# Patient Record
Sex: Female | Born: 1960 | Race: Black or African American | Hispanic: No | Marital: Single | Smoking: Former smoker
Health system: Southern US, Community
[De-identification: ages and names within clinical notes are randomized; demographics above are authoritative.]

## PROBLEM LIST (undated history)

## (undated) DIAGNOSIS — E785 Hyperlipidemia, unspecified: Secondary | ICD-10-CM

## (undated) DIAGNOSIS — M109 Gout, unspecified: Secondary | ICD-10-CM

## (undated) DIAGNOSIS — E119 Type 2 diabetes mellitus without complications: Secondary | ICD-10-CM

## (undated) DIAGNOSIS — K219 Gastro-esophageal reflux disease without esophagitis: Secondary | ICD-10-CM

## (undated) HISTORY — PX: PARTIAL HYSTERECTOMY: SHX80

---

## 2005-02-14 ENCOUNTER — Ambulatory Visit: Payer: Self-pay | Admitting: Occupational Therapy

## 2005-02-24 ENCOUNTER — Ambulatory Visit: Payer: Self-pay | Admitting: Occupational Therapy

## 2005-03-27 ENCOUNTER — Ambulatory Visit: Payer: Self-pay | Admitting: Occupational Therapy

## 2005-04-26 ENCOUNTER — Ambulatory Visit: Payer: Self-pay | Admitting: Occupational Therapy

## 2005-05-27 ENCOUNTER — Ambulatory Visit: Payer: Self-pay | Admitting: Occupational Therapy

## 2006-05-29 ENCOUNTER — Ambulatory Visit: Payer: Self-pay | Admitting: Nurse Practitioner

## 2007-06-26 ENCOUNTER — Ambulatory Visit: Payer: Self-pay | Admitting: Nurse Practitioner

## 2008-07-14 ENCOUNTER — Ambulatory Visit: Payer: Self-pay | Admitting: Nurse Practitioner

## 2009-07-20 ENCOUNTER — Ambulatory Visit: Payer: Self-pay | Admitting: Nurse Practitioner

## 2010-07-23 ENCOUNTER — Ambulatory Visit: Payer: Self-pay | Admitting: Nurse Practitioner

## 2011-09-03 ENCOUNTER — Ambulatory Visit: Payer: Self-pay | Admitting: Nurse Practitioner

## 2012-09-03 ENCOUNTER — Ambulatory Visit: Payer: Self-pay | Admitting: Nurse Practitioner

## 2013-03-03 ENCOUNTER — Ambulatory Visit: Payer: Self-pay | Admitting: Internal Medicine

## 2013-09-07 ENCOUNTER — Ambulatory Visit: Payer: Self-pay | Admitting: Nurse Practitioner

## 2013-10-22 ENCOUNTER — Ambulatory Visit: Payer: Self-pay | Admitting: Gastroenterology

## 2014-01-04 ENCOUNTER — Ambulatory Visit: Payer: Self-pay | Admitting: Nurse Practitioner

## 2014-06-30 ENCOUNTER — Ambulatory Visit: Payer: Self-pay | Admitting: Nurse Practitioner

## 2014-10-31 ENCOUNTER — Other Ambulatory Visit: Payer: Self-pay | Admitting: Nurse Practitioner

## 2014-10-31 DIAGNOSIS — N939 Abnormal uterine and vaginal bleeding, unspecified: Secondary | ICD-10-CM

## 2014-10-31 DIAGNOSIS — Z1239 Encounter for other screening for malignant neoplasm of breast: Secondary | ICD-10-CM

## 2014-10-31 DIAGNOSIS — Z1231 Encounter for screening mammogram for malignant neoplasm of breast: Secondary | ICD-10-CM

## 2014-11-01 ENCOUNTER — Ambulatory Visit
Admission: RE | Admit: 2014-11-01 | Discharge: 2014-11-01 | Disposition: A | Discharge status: Home / Self Care | Payer: Medicare Other | Source: Ambulatory Visit | Attending: Nurse Practitioner | Admitting: Nurse Practitioner

## 2014-11-01 ENCOUNTER — Ambulatory Visit: Payer: Medicare Other

## 2014-11-01 DIAGNOSIS — N939 Abnormal uterine and vaginal bleeding, unspecified: Secondary | ICD-10-CM | POA: Diagnosis not present

## 2014-11-09 ENCOUNTER — Ambulatory Visit
Admission: RE | Admit: 2014-11-09 | Discharge: 2014-11-09 | Disposition: A | Discharge status: Home / Self Care | Payer: Medicare Other | Source: Ambulatory Visit | Attending: Nurse Practitioner | Admitting: Nurse Practitioner

## 2014-11-09 DIAGNOSIS — Z1231 Encounter for screening mammogram for malignant neoplasm of breast: Secondary | ICD-10-CM | POA: Insufficient documentation

## 2014-11-09 DIAGNOSIS — Z1239 Encounter for other screening for malignant neoplasm of breast: Secondary | ICD-10-CM

## 2015-08-15 ENCOUNTER — Other Ambulatory Visit: Payer: Self-pay | Admitting: Nurse Practitioner

## 2015-08-15 DIAGNOSIS — Z1231 Encounter for screening mammogram for malignant neoplasm of breast: Secondary | ICD-10-CM

## 2015-11-13 ENCOUNTER — Ambulatory Visit: Admission: RE | Admit: 2015-11-13 | Payer: Medicare Other | Source: Ambulatory Visit

## 2015-11-20 ENCOUNTER — Ambulatory Visit: Payer: Medicare Other

## 2015-11-27 ENCOUNTER — Ambulatory Visit
Admission: RE | Admit: 2015-11-27 | Discharge: 2015-11-27 | Disposition: A | Discharge status: Home / Self Care | Payer: Medicare Other | Source: Ambulatory Visit | Attending: Nurse Practitioner | Admitting: Nurse Practitioner

## 2015-11-27 DIAGNOSIS — Z1231 Encounter for screening mammogram for malignant neoplasm of breast: Secondary | ICD-10-CM | POA: Diagnosis present

## 2016-08-16 ENCOUNTER — Other Ambulatory Visit: Payer: Self-pay | Admitting: Nurse Practitioner

## 2016-08-16 DIAGNOSIS — R109 Unspecified abdominal pain: Secondary | ICD-10-CM

## 2016-08-20 ENCOUNTER — Ambulatory Visit: Payer: Medicare Other

## 2016-08-20 ENCOUNTER — Ambulatory Visit
Admission: RE | Admit: 2016-08-20 | Discharge: 2016-08-20 | Disposition: A | Discharge status: Home / Self Care | Payer: Medicare Other | Source: Ambulatory Visit | Attending: Nurse Practitioner | Admitting: Nurse Practitioner

## 2016-08-20 DIAGNOSIS — Z9049 Acquired absence of other specified parts of digestive tract: Secondary | ICD-10-CM | POA: Diagnosis not present

## 2016-08-20 DIAGNOSIS — R161 Splenomegaly, not elsewhere classified: Secondary | ICD-10-CM | POA: Diagnosis not present

## 2016-08-20 DIAGNOSIS — R109 Unspecified abdominal pain: Secondary | ICD-10-CM | POA: Diagnosis present

## 2016-08-20 DIAGNOSIS — R932 Abnormal findings on diagnostic imaging of liver and biliary tract: Secondary | ICD-10-CM | POA: Diagnosis not present

## 2016-09-09 ENCOUNTER — Other Ambulatory Visit: Payer: Self-pay | Admitting: Nurse Practitioner

## 2016-09-09 DIAGNOSIS — R161 Splenomegaly, not elsewhere classified: Secondary | ICD-10-CM

## 2016-09-16 ENCOUNTER — Ambulatory Visit: Payer: Medicare Other

## 2016-09-16 ENCOUNTER — Ambulatory Visit
Admission: RE | Admit: 2016-09-16 | Discharge: 2016-09-16 | Disposition: A | Discharge status: Home / Self Care | Payer: Medicare Other | Source: Ambulatory Visit | Attending: Nurse Practitioner | Admitting: Nurse Practitioner

## 2016-09-16 DIAGNOSIS — R161 Splenomegaly, not elsewhere classified: Secondary | ICD-10-CM | POA: Diagnosis not present

## 2016-12-27 ENCOUNTER — Other Ambulatory Visit: Payer: Self-pay | Admitting: Nurse Practitioner

## 2016-12-27 DIAGNOSIS — Z1231 Encounter for screening mammogram for malignant neoplasm of breast: Secondary | ICD-10-CM

## 2017-01-07 ENCOUNTER — Ambulatory Visit
Admission: RE | Admit: 2017-01-07 | Discharge: 2017-01-07 | Disposition: A | Discharge status: Home / Self Care | Payer: Medicare Other | Source: Ambulatory Visit | Attending: Nurse Practitioner | Admitting: Nurse Practitioner

## 2017-01-07 DIAGNOSIS — Z1231 Encounter for screening mammogram for malignant neoplasm of breast: Secondary | ICD-10-CM | POA: Diagnosis present

## 2017-05-07 IMAGING — US US PELVIS COMPLETE
1 series · 14 of 25 positions shown · non-contrast
Comparison: None

CLINICAL DATA: Abnormal vaginal bleeding.

EXAM:
TRANSABDOMINAL AND TRANSVAGINAL ULTRASOUND OF PELVIS
TECHNIQUE: Both transabdominal and transvaginal ultrasound examinations of the
pelvis were performed. Transabdominal technique was performed for
global imaging of the pelvis including uterus, ovaries, adnexal
regions, and pelvic cul-de-sac. It was necessary to proceed with
endovaginal exam following the transabdominal exam to visualize the
uterus and ovaries..

[Series 1: us pelvis complete · 0.25mm/px · 14 of 89 slices shown]
[im 1/89]
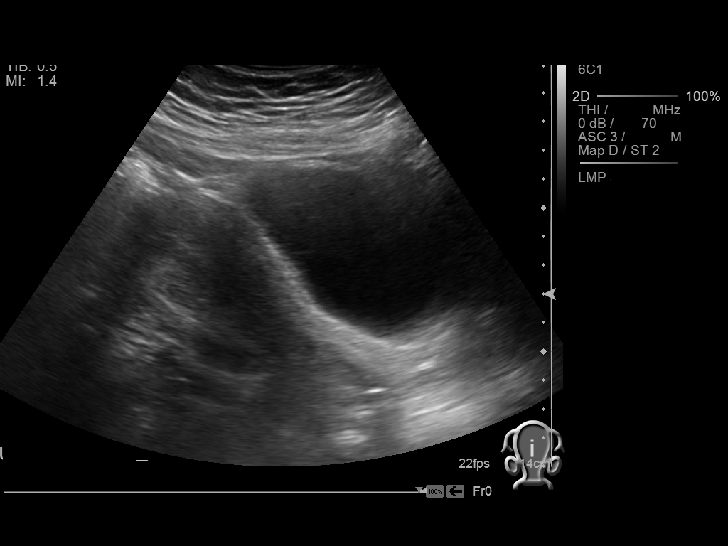
[im 8/89]
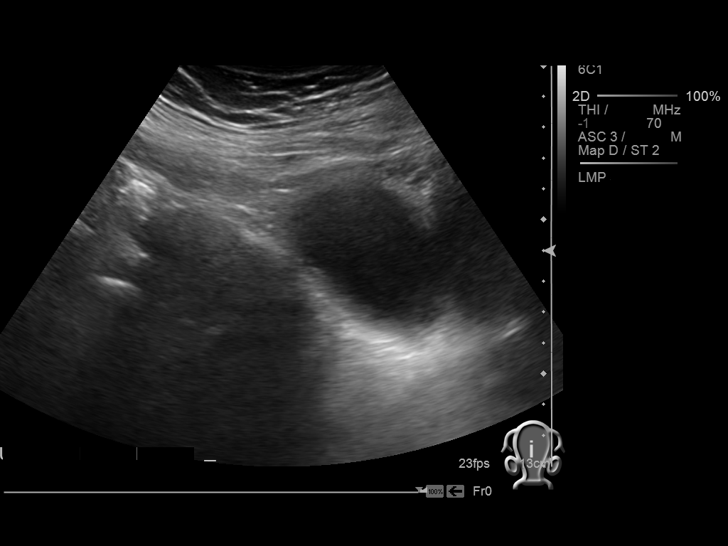
[im 15/89]
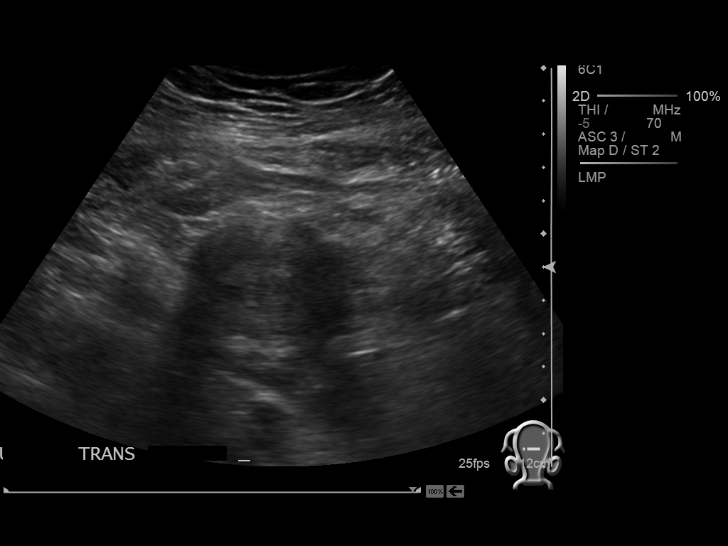
[im 23/89]
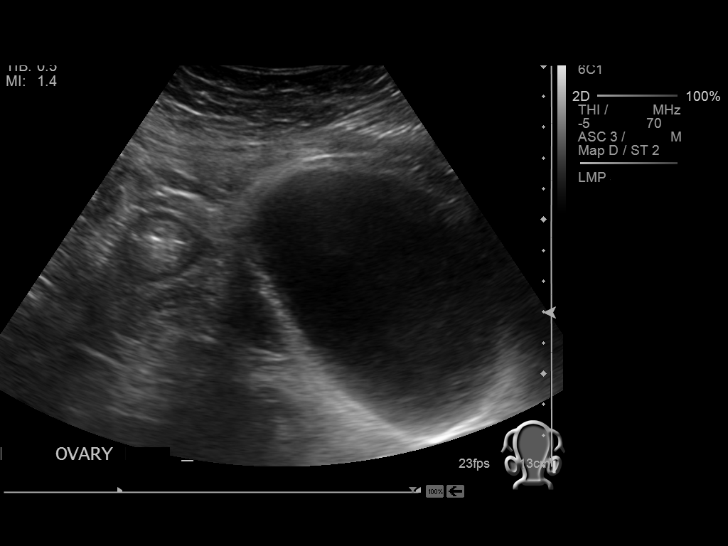
[im 30/89]
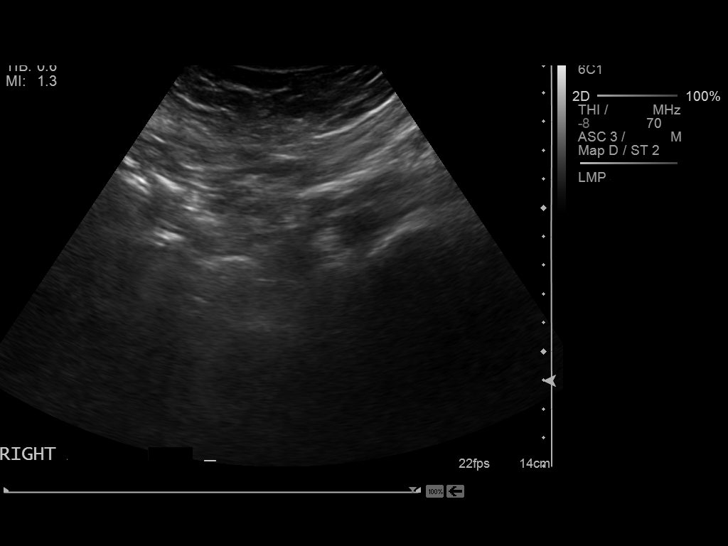
[im 34/89]
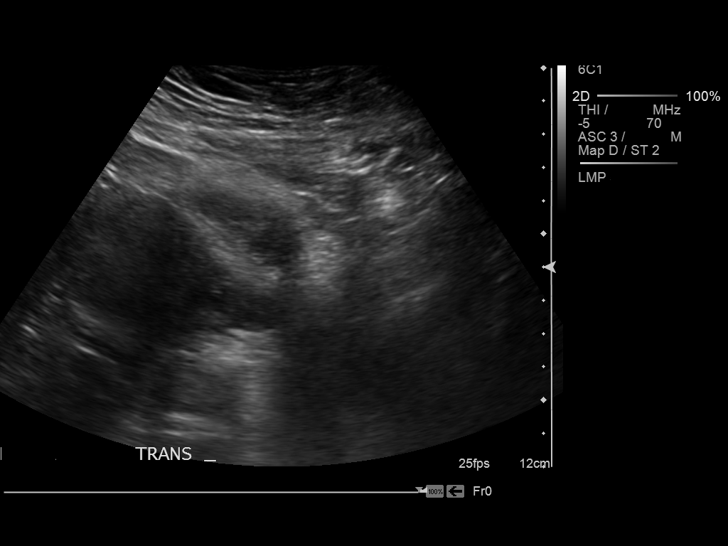
[im 41/89]
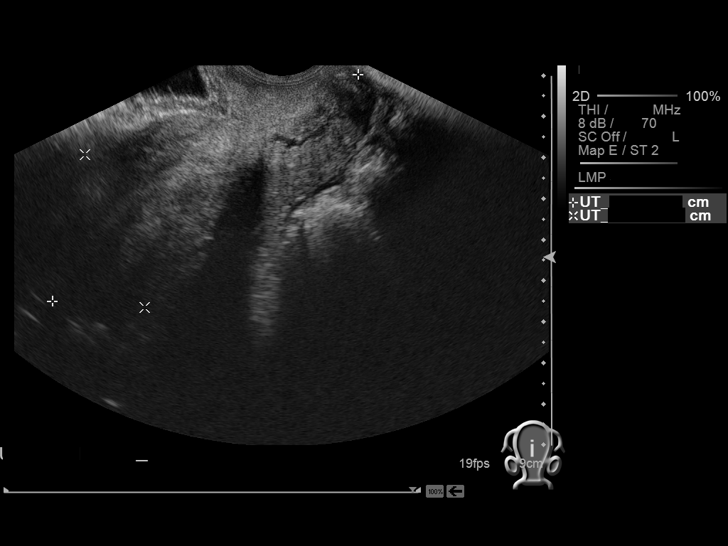
[im 48/89]
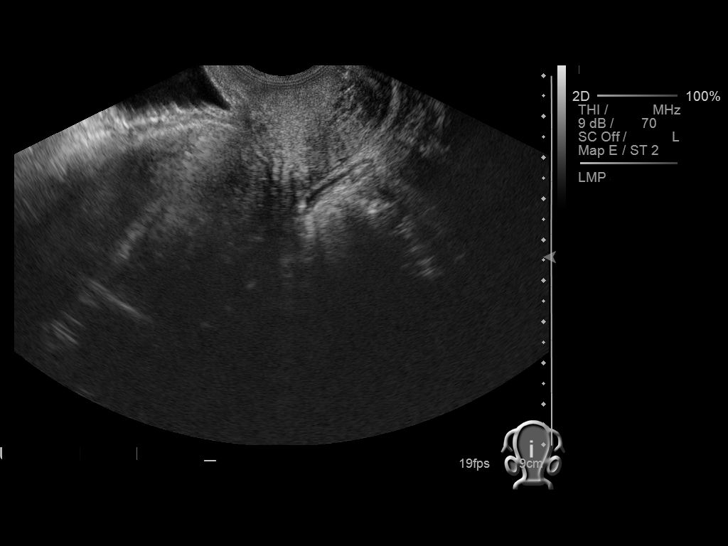
[im 56/89]
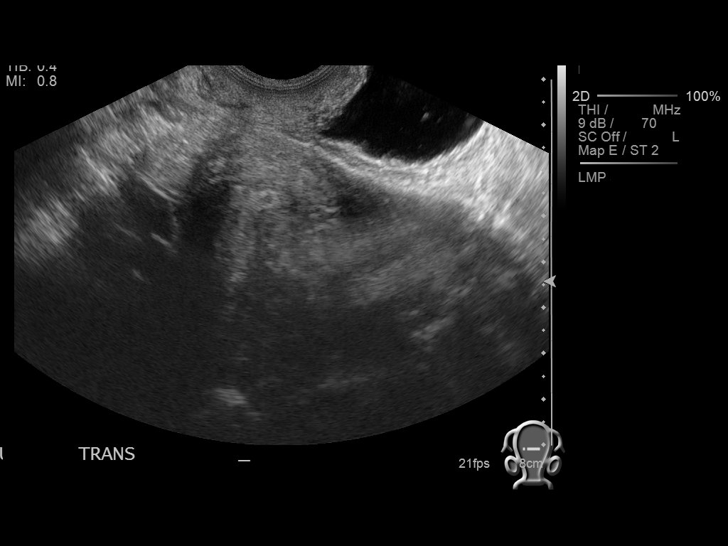
[im 59/89]
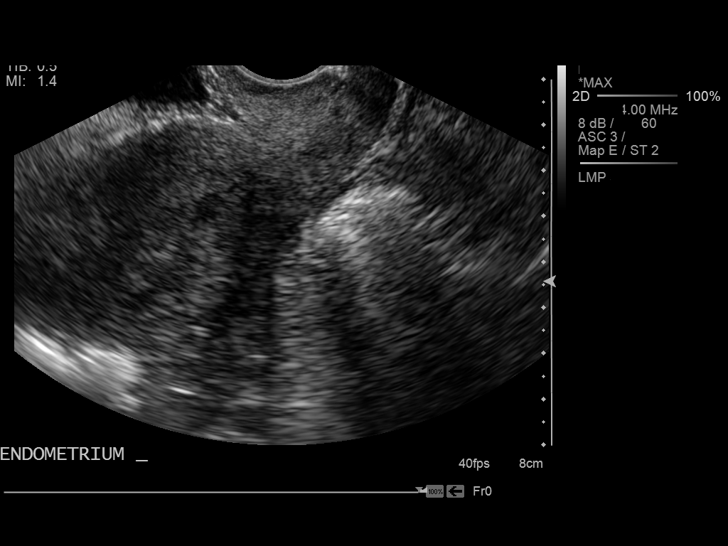
[im 67/89]
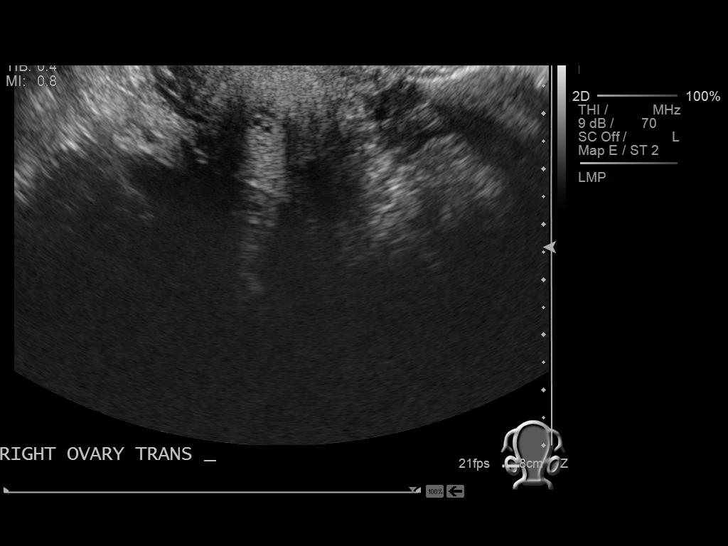
[im 74/89]
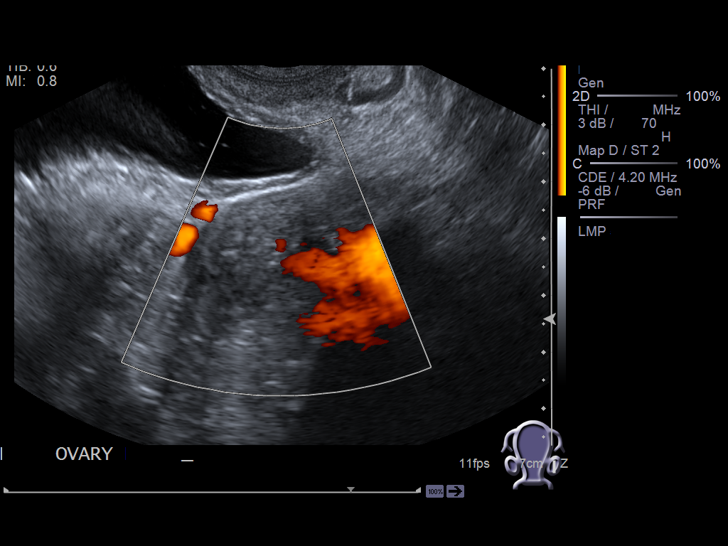
[im 81/89]
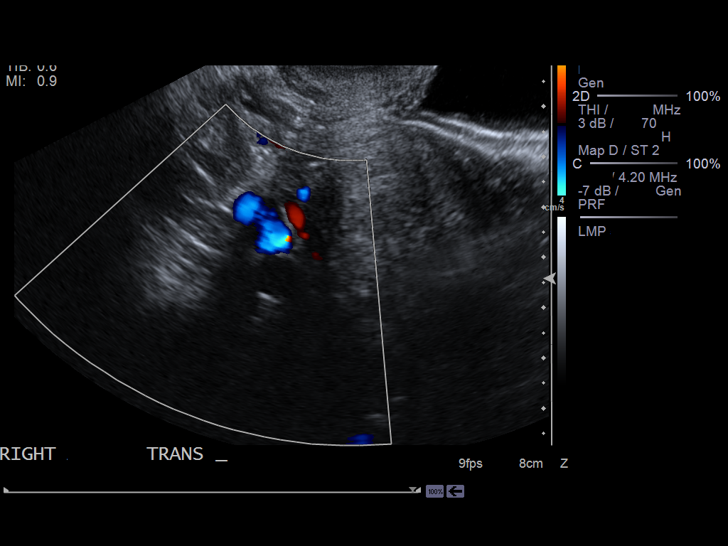
[im 89/89]
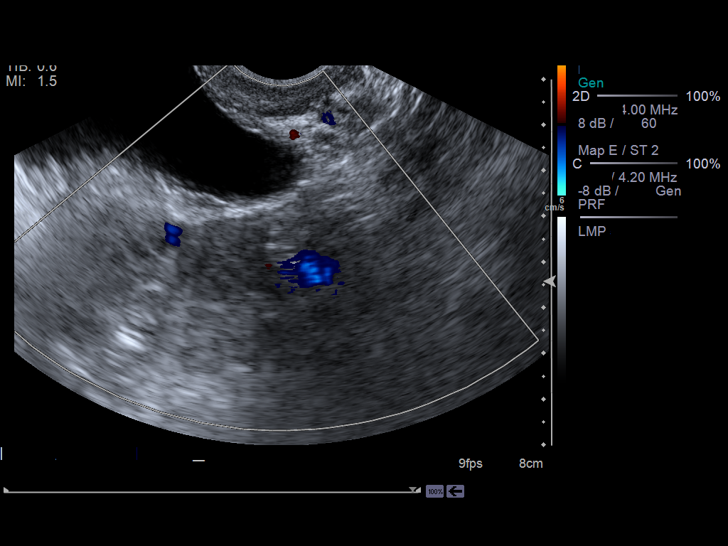

[14 of 25 positions shown; findings below may reference images not displayed]

FINDINGS: Uterus

Measurements: 10.7 x 3.5 x 5.2 cm. No fibroids or other mass
visualized.

Endometrium

Thickness: 7.2 mm.  No focal abnormality visualized.

Right ovary

Measurements: 2.8 x 1.6 x 1.4 cm. Normal appearance/no adnexal mass.

Left ovary

Measurements: 2.8 x 1.7 x 2.5 cm. Normal appearance/no adnexal mass.

Other findings

No free fluid.
IMPRESSION: Normal exam.

## 2018-01-14 ENCOUNTER — Other Ambulatory Visit: Payer: Self-pay | Admitting: Nurse Practitioner

## 2018-01-14 DIAGNOSIS — Z1231 Encounter for screening mammogram for malignant neoplasm of breast: Secondary | ICD-10-CM

## 2018-01-16 ENCOUNTER — Other Ambulatory Visit: Payer: Self-pay | Admitting: Nurse Practitioner

## 2018-01-16 DIAGNOSIS — R161 Splenomegaly, not elsewhere classified: Secondary | ICD-10-CM

## 2018-01-27 ENCOUNTER — Ambulatory Visit
Admission: RE | Admit: 2018-01-27 | Discharge: 2018-01-27 | Disposition: A | Discharge status: Home / Self Care | Payer: Medicare Other | Source: Ambulatory Visit | Attending: Nurse Practitioner | Admitting: Nurse Practitioner

## 2018-01-27 ENCOUNTER — Encounter (INDEPENDENT_AMBULATORY_CARE_PROVIDER_SITE_OTHER): Payer: Self-pay

## 2018-01-27 DIAGNOSIS — R161 Splenomegaly, not elsewhere classified: Secondary | ICD-10-CM

## 2018-01-27 DIAGNOSIS — Z1231 Encounter for screening mammogram for malignant neoplasm of breast: Secondary | ICD-10-CM | POA: Diagnosis present

## 2019-01-15 ENCOUNTER — Other Ambulatory Visit: Payer: Self-pay | Admitting: Nurse Practitioner

## 2019-01-15 DIAGNOSIS — Z1231 Encounter for screening mammogram for malignant neoplasm of breast: Secondary | ICD-10-CM

## 2019-01-21 ENCOUNTER — Encounter: Payer: Self-pay | Admitting: *Deleted

## 2019-01-21 ENCOUNTER — Telehealth: Payer: Self-pay | Admitting: Gastroenterology

## 2019-01-21 ENCOUNTER — Other Ambulatory Visit: Payer: Self-pay

## 2019-01-21 DIAGNOSIS — Z860100 Personal history of colon polyps, unspecified: Secondary | ICD-10-CM

## 2019-01-21 DIAGNOSIS — Z1211 Encounter for screening for malignant neoplasm of colon: Secondary | ICD-10-CM

## 2019-01-21 NOTE — Telephone Encounter (Signed)
Gastroenterology Pre-Procedure Review  Request Date: Friday 02/12/2019   [place] Requesting Physician: Dr. Allen Norris   PATIENT REVIEW QUESTIONS: The patient responded to the following health history questions as indicated:    1. Are you having any GI issues? no 2. Do you have a personal history of Polyps? yes (10 yrs ago) 3. Do you have a family history of Colon Cancer or Polyps? no 4. Diabetes Mellitus? yes (Type II) 5. Joint replacements in the past 12 months?no 6. Major health problems in the past 3 months?no 7. Any artificial heart valves, MVP, or defibrillator?no   BMI: 30      No pulmonary disease MEDICATIONS & ALLERGIES:    Patient reports the following regarding taking any anticoagulation/antiplatelet therapy:   Plavix, Coumadin, Eliquis, Xarelto, Lovenox, Pradaxa, Brilinta, or Effient? no Aspirin? yes (81 mg)  Patient confirms/reports the following medications:  No current outpatient medications on file.   No current facility-administered medications for this visit.     Patient confirms/reports the following allergies:  Not on File  No orders of the defined types were placed in this encounter.   AUTHORIZATION INFORMATION Primary Insurance: 1D#: Group #:  Secondary Insurance: 1D#: Group #:  SCHEDULE INFORMATION: Date:  Time: Location:

## 2019-02-04 ENCOUNTER — Other Ambulatory Visit: Payer: Self-pay

## 2019-02-09 ENCOUNTER — Other Ambulatory Visit
Admission: RE | Admit: 2019-02-09 | Discharge: 2019-02-09 | Disposition: A | Discharge status: Home / Self Care | Payer: Medicare Other | Source: Ambulatory Visit | Attending: Gastroenterology | Admitting: Gastroenterology

## 2019-02-09 ENCOUNTER — Other Ambulatory Visit: Payer: Self-pay

## 2019-02-09 DIAGNOSIS — Z20828 Contact with and (suspected) exposure to other viral communicable diseases: Secondary | ICD-10-CM | POA: Insufficient documentation

## 2019-02-09 DIAGNOSIS — Z01812 Encounter for preprocedural laboratory examination: Secondary | ICD-10-CM | POA: Insufficient documentation

## 2019-02-09 LAB — SARS CORONAVIRUS 2 (TAT 6-24 HRS): SARS Coronavirus 2: NEGATIVE

## 2019-02-10 ENCOUNTER — Other Ambulatory Visit: Payer: Self-pay

## 2019-02-10 ENCOUNTER — Ambulatory Visit
Admission: RE | Admit: 2019-02-10 | Discharge: 2019-02-10 | Disposition: A | Discharge status: Home / Self Care | Payer: Medicare Other | Source: Ambulatory Visit | Attending: Nurse Practitioner | Admitting: Nurse Practitioner

## 2019-02-10 DIAGNOSIS — Z1231 Encounter for screening mammogram for malignant neoplasm of breast: Secondary | ICD-10-CM | POA: Diagnosis not present

## 2019-02-12 ENCOUNTER — Ambulatory Visit: Payer: Medicare Other | Admitting: Anesthesiology

## 2019-02-12 ENCOUNTER — Other Ambulatory Visit: Payer: Self-pay

## 2019-02-12 ENCOUNTER — Ambulatory Visit
Admission: RE | Admit: 2019-02-12 | Discharge: 2019-02-12 | Disposition: A | Discharge status: Home / Self Care | Payer: Medicare Other | Attending: Gastroenterology | Admitting: Gastroenterology

## 2019-02-12 ENCOUNTER — Encounter
Admission: RE | Disposition: A | Discharge status: Home / Self Care | Payer: Self-pay | Source: Home / Self Care | Attending: Gastroenterology

## 2019-02-12 DIAGNOSIS — D123 Benign neoplasm of transverse colon: Secondary | ICD-10-CM | POA: Insufficient documentation

## 2019-02-12 DIAGNOSIS — M109 Gout, unspecified: Secondary | ICD-10-CM | POA: Insufficient documentation

## 2019-02-12 DIAGNOSIS — E119 Type 2 diabetes mellitus without complications: Secondary | ICD-10-CM | POA: Diagnosis not present

## 2019-02-12 DIAGNOSIS — Z6841 Body Mass Index (BMI) 40.0 and over, adult: Secondary | ICD-10-CM | POA: Diagnosis not present

## 2019-02-12 DIAGNOSIS — E785 Hyperlipidemia, unspecified: Secondary | ICD-10-CM | POA: Insufficient documentation

## 2019-02-12 DIAGNOSIS — Z79899 Other long term (current) drug therapy: Secondary | ICD-10-CM | POA: Insufficient documentation

## 2019-02-12 DIAGNOSIS — K640 First degree hemorrhoids: Secondary | ICD-10-CM | POA: Diagnosis not present

## 2019-02-12 DIAGNOSIS — K219 Gastro-esophageal reflux disease without esophagitis: Secondary | ICD-10-CM | POA: Diagnosis not present

## 2019-02-12 DIAGNOSIS — Z7982 Long term (current) use of aspirin: Secondary | ICD-10-CM | POA: Diagnosis not present

## 2019-02-12 DIAGNOSIS — K5730 Diverticulosis of large intestine without perforation or abscess without bleeding: Secondary | ICD-10-CM | POA: Insufficient documentation

## 2019-02-12 DIAGNOSIS — Z87891 Personal history of nicotine dependence: Secondary | ICD-10-CM | POA: Diagnosis not present

## 2019-02-12 DIAGNOSIS — I10 Essential (primary) hypertension: Secondary | ICD-10-CM | POA: Diagnosis not present

## 2019-02-12 DIAGNOSIS — Z1211 Encounter for screening for malignant neoplasm of colon: Secondary | ICD-10-CM | POA: Diagnosis not present

## 2019-02-12 DIAGNOSIS — K635 Polyp of colon: Secondary | ICD-10-CM

## 2019-02-12 HISTORY — DX: Gout, unspecified: M10.9

## 2019-02-12 HISTORY — PX: COLONOSCOPY WITH PROPOFOL: SHX5780

## 2019-02-12 HISTORY — PX: POLYPECTOMY: SHX5525

## 2019-02-12 HISTORY — DX: Type 2 diabetes mellitus without complications: E11.9

## 2019-02-12 HISTORY — DX: Gastro-esophageal reflux disease without esophagitis: K21.9

## 2019-02-12 HISTORY — DX: Hyperlipidemia, unspecified: E78.5

## 2019-02-12 LAB — GLUCOSE, CAPILLARY
Glucose-Capillary: 69 mg/dL — ABNORMAL LOW (ref 70–99)
Glucose-Capillary: 79 mg/dL (ref 70–99)

## 2019-02-12 SURGERY — COLONOSCOPY WITH PROPOFOL
Anesthesia: General | Site: Rectum

## 2019-02-12 MED ORDER — SEVOFLURANE IN SOLN
RESPIRATORY_TRACT | Status: AC
  Filled 2019-02-12: qty 250

## 2019-02-12 MED ORDER — STERILE WATER FOR IRRIGATION IR SOLN
Status: DC | PRN
  Administered 2019-02-12: .05 mL

## 2019-02-12 MED ORDER — PROPOFOL 10 MG/ML IV BOLUS
INTRAVENOUS | Status: DC | PRN
  Administered 2019-02-12: 30 mg via INTRAVENOUS
  Administered 2019-02-12: 100 mg via INTRAVENOUS
  Administered 2019-02-12 (×3): 20 mg via INTRAVENOUS
  Administered 2019-02-12: 30 mg via INTRAVENOUS

## 2019-02-12 MED ORDER — LACTATED RINGERS IV SOLN
INTRAVENOUS | Status: DC
  Administered 2019-02-12: 10:00:00 via INTRAVENOUS

## 2019-02-12 MED ORDER — DEXTROSE 50 % IV SOLN
12.5000 g | Freq: Once | INTRAVENOUS | Status: AC
  Administered 2019-02-12: 12.5 g via INTRAVENOUS

## 2019-02-12 MED ORDER — LIDOCAINE HCL (CARDIAC) PF 100 MG/5ML IV SOSY
PREFILLED_SYRINGE | INTRAVENOUS | Status: DC | PRN
  Administered 2019-02-12: 30 mg via INTRAVENOUS

## 2019-02-12 SURGICAL SUPPLY — 8 items
CANISTER SUCT 1200ML W/VALVE (MISCELLANEOUS) ×4 IMPLANT
FORCEPS BIOP RAD 4 LRG CAP 4 (CUTTING FORCEPS) ×4 IMPLANT
GOWN CVR UNV OPN BCK APRN NK (MISCELLANEOUS) ×4 IMPLANT
GOWN ISOL THUMB LOOP REG UNIV (MISCELLANEOUS) ×4
KIT ENDO PROCEDURE OLY (KITS) ×4 IMPLANT
SNARE SHORT THROW 13M SML OVAL (MISCELLANEOUS) ×4 IMPLANT
TRAP ETRAP POLY (MISCELLANEOUS) ×4 IMPLANT
WATER STERILE IRR 250ML POUR (IV SOLUTION) ×4 IMPLANT

## 2019-02-12 NOTE — Op Note (Signed)
[facility] Patient Name: Jacqueline Chavez Procedure Date: 02/12/2019 9:39 AM MRN: [id number] Account #: [id number] Date of Birth: [birth year] Admit Type: Outpatient Age: 58 Room: [facility] OR ROOM 01 Gender: Female Note Status: Finalized Procedure:            Colonoscopy Indications:          Screening for colorectal malignant neoplasm Providers:            Lucilla Lame MD, MD Referring MD:         Renee Rival (Referring MD) Medicines:            Propofol per Anesthesia Complications:        No immediate complications. Procedure:            Pre-Anesthesia Assessment:                       - Prior to the procedure, a History and Physical was                        performed, and patient medications and allergies were                        reviewed. The patient's tolerance of previous                        anesthesia was also reviewed. The risks and benefits of                        the procedure and the sedation options and risks were                        discussed with the patient. All questions were                        answered, and informed consent was obtained. Prior                        Anticoagulants: The patient has taken no previous                        anticoagulant or antiplatelet agents. ASA Grade                        Assessment: II - A patient with mild systemic disease.                        After reviewing the risks and benefits, the patient was                        deemed in satisfactory condition to undergo the                        procedure.                       After obtaining informed consent, the colonoscope was                        passed under direct vision. Throughout the procedure,  the patient's blood pressure, pulse, and oxygen                        saturations were monitored continuously. The was                        introduced through the anus and advanced to the the                  cecum, identified by appendiceal orifice and ileocecal                        valve. The colonoscopy was performed without                        difficulty. The patient tolerated the procedure well.                        The quality of the bowel preparation was excellent. Findings:      The perianal and digital rectal examinations were normal.      Multiple small-mouthed diverticula were found in the sigmoid colon.      A 4 mm polyp was found in the transverse colon. The polyp was sessile.       The polyp was removed with a cold snare. Resection and retrieval were       complete.      The mucosa vascular pattern in the sigmoid colon was locally decreased.       This was biopsied with a cold forceps for histology.      Non-bleeding internal hemorrhoids were found during retroflexion. The       hemorrhoids were Grade I (internal hemorrhoids that do not prolapse). Impression:           - Diverticulosis in the sigmoid colon.                       - One 4 mm polyp in the transverse colon, removed with                        a cold snare. Resected and retrieved.                       - Decreased mucosa vascular pattern in the sigmoid                        colon. Biopsied.                       - Non-bleeding internal hemorrhoids. Recommendation:       - Discharge patient to home.                       - Resume previous diet.                       - Continue present medications.                       - Await pathology results.                       - Repeat colonoscopy in 5 years if polyp adenoma and 10  years if hyperplastic Procedure Code(s):    --- Professional ---                       [phone], Colonoscopy, flexible; with removal of tumor(s),                        polyp(s), or other lesion(s) by snare technique                       45380, 59, Colonoscopy, flexible; with biopsy, single                        or multiple Diagnosis Code(s):    ---  Professional ---                       Z12.11, Encounter for screening for malignant neoplasm                        of colon                       K63.5, Polyp of colon CPT copyright 2019 American Medical Association. All rights reserved. The codes documented in this report are preliminary and upon coder review may  be revised to meet current compliance requirements. Lucilla Lame MD, MD 02/12/2019 10:08:21 AM This report has been signed electronically. Number of Addenda: 0 Note Initiated On: 02/12/2019 9:39 AM Scope Withdrawal Time: 0 hours 8 minutes 23 seconds  Total Procedure Duration: 0 hours 10 minutes 18 seconds  Estimated Blood Loss: Estimated blood loss: none.      [facility]

## 2019-02-12 NOTE — Transfer of Care (Signed)
Immediate Anesthesia Transfer of Care Note  Patient: Jacqueline Chavez  Procedure(s) Performed: COLONOSCOPY WITH PROPOFOL (N/A Rectum) POLYPECTOMY (Rectum)  Patient Location: PACU  Anesthesia Type: General  Level of Consciousness: awake, alert  and patient cooperative  Airway and Oxygen Therapy: Patient Spontanous Breathing and Patient connected to supplemental oxygen  Post-op Assessment: Post-op Vital signs reviewed, Patient's Cardiovascular Status Stable, Respiratory Function Stable, Patent Airway and No signs of Nausea or vomiting  Post-op Vital Signs: Reviewed and stable  Complications: No apparent anesthesia complications

## 2019-02-12 NOTE — H&P (Signed)
Lucilla Lame, MD [facility] [address] [place], [place] [address] Phone: [phone] Fax : [phone]  Primary Care Physician:  Renee Rival, NP Primary Gastroenterologist:  Dr. Allen Norris  Pre-Procedure History & Physical: HPI:  Jacqueline Chavez is a 58 y.o. female is here for a screening colonoscopy.   Past Medical History:  Diagnosis Date  . Diabetes mellitus without complication ([place])   . GERD (gastroesophageal reflux disease)   . Gout   . Hyperlipidemia     Past Surgical History:  Procedure Laterality Date  . PARTIAL HYSTERECTOMY      Prior to Admission medications   Medication Sig Start Date End Date Taking? Authorizing Provider  allopurinol (ZYLOPRIM) 100 MG tablet Take 50 mg by mouth daily.   Yes [provider]  aspirin EC 81 MG tablet Take 81 mg by mouth daily.   Yes [provider]  atorvastatin (LIPITOR) 10 MG tablet Take 20 mg by mouth daily.   Yes [provider]  Cholecalciferol (VITAMIN D) 50 MCG (2000 UT) CAPS Take by mouth.   Yes [provider]  furosemide (LASIX) 20 MG tablet Take 20 mg by mouth.   Yes [provider]  glipiZIDE (GLUCOTROL) 10 MG tablet Take 10 mg by mouth daily before breakfast.   Yes [provider]  Insulin Degludec (TRESIBA [place]) Inject 30 Units into the skin daily.   Yes [provider]  lansoprazole (PREVACID) 30 MG capsule Take 30 mg by mouth daily at 12 noon.   Yes [provider]  Semaglutide (OZEMPIC, 1 MG/DOSE, [place]) Inject 5 Units into the skin once a week.   Yes [provider]  sitaGLIPtin (JANUVIA) 100 MG tablet Take 100 mg by mouth daily.   Yes [provider]    Allergies as of 01/21/2019  . (Not on File)    Family History  Problem Relation Age of Onset  . Breast cancer Neg Hx     Social History   Socioeconomic History  . Marital status: Single    Spouse name: Not on file  . Number of children: Not on file  . Years  of education: Not on file  . Highest education level: Not on file  Occupational History  . Not on file  Social Needs  . Financial resource strain: Not on file  . Food insecurity    Worry: Not on file    Inability: Not on file  . Transportation needs    Medical: Not on file    Non-medical: Not on file  Tobacco Use  . Smoking status: Former Research scientist (life sciences)  . Smokeless tobacco: Never Used  Substance and Sexual Activity  . Alcohol use: Not Currently  . Drug use: Not on file  . Sexual activity: Not on file  Lifestyle  . Physical activity    Days per week: Not on file    Minutes per session: Not on file  . Stress: Not on file  Relationships  . Social Herbalist on phone: Not on file    Gets together: Not on file    Attends religious service: Not on file    Active member of club or organization: Not on file    Attends meetings of clubs or organizations: Not on file    Relationship status: Not on file  . Intimate partner violence    Fear of current or ex partner: Not on file    Emotionally abused: Not on file    Physically  abused: Not on file    Forced sexual activity: Not on file  Other Topics Concern  . Not on file  Social History Narrative  . Not on file    Review of Systems: See HPI, otherwise negative ROS  Physical Exam: BP 130/79   Pulse 72   Temp (!) 97.2 F (36.2 C) (Temporal)   Resp 16   Ht 5\' 3"  (1.6 m)   Wt 114.8 kg   LMP 10/20/2014 (Approximate)   SpO2 100%   BMI 44.82 kg/m  General:   Alert,  pleasant and cooperative in NAD Head:  Normocephalic and atraumatic. Neck:  Supple; no masses or thyromegaly. Lungs:  Clear throughout to auscultation.    Heart:  Regular rate and rhythm. Abdomen:  Soft, nontender and nondistended. Normal bowel sounds, without guarding, and without rebound.   Neurologic:  Alert and  oriented x4;  grossly normal neurologically.  Impression/Plan: Jacqueline Chavez is now here to undergo a screening colonoscopy.  Risks,  benefits, and alternatives regarding colonoscopy have been reviewed with the patient.  Questions have been answered.  All parties agreeable.

## 2019-02-12 NOTE — Anesthesia Procedure Notes (Signed)
Date/Time: 02/12/2019 9:50 AM Performed by: Cameron Ali, CRNA Pre-anesthesia Checklist: Patient identified, Emergency Drugs available, Suction available, Timeout performed and Patient being monitored Patient Re-evaluated:Patient Re-evaluated prior to induction Oxygen Delivery Method: Nasal cannula Placement Confirmation: positive ETCO2

## 2019-02-12 NOTE — Anesthesia Preprocedure Evaluation (Addendum)
Anesthesia Evaluation  Patient identified by MRN, date of birth, ID band Patient awake    History of Anesthesia Complications Negative for: history of anesthetic complications  Airway Mallampati: III  TM Distance: >3 FB   Mouth opening: Limited Mouth Opening  Dental no notable dental hx.    Pulmonary former smoker,    Pulmonary exam normal        Cardiovascular Exercise Tolerance: Good hypertension, Pt. on medications Normal cardiovascular exam     Neuro/Psych negative neurological ROS     GI/Hepatic GERD  Medicated and Controlled,  Endo/Other  diabetes, Type 2, Insulin DependentMorbid obesity  Renal/GU      Musculoskeletal   Abdominal   Peds  Hematology   Anesthesia Other Findings   Reproductive/Obstetrics                           Anesthesia Physical Anesthesia Plan  ASA: III  Anesthesia Plan: General   Post-op Pain Management:    Induction:   PONV Risk Score and Plan: 3 and Propofol infusion and TIVA  Airway Management Planned:   Additional Equipment:   Intra-op Plan:   Post-operative Plan:   Informed Consent: I have reviewed the patients History and Physical, chart, labs and discussed the procedure including the risks, benefits and alternatives for the proposed anesthesia with the patient or authorized representative who has indicated his/her understanding and acceptance.       Plan Discussed with:   Anesthesia Plan Comments:        Anesthesia Quick Evaluation

## 2019-02-12 NOTE — Discharge Instructions (Signed)

## 2019-02-12 NOTE — Anesthesia Postprocedure Evaluation (Signed)
Anesthesia Post Note  Patient: Jacqueline Chavez  Procedure(s) Performed: COLONOSCOPY WITH PROPOFOL (N/A Rectum) POLYPECTOMY (Rectum)  Patient location during evaluation: PACU Anesthesia Type: General Level of consciousness: awake and alert Pain management: pain level controlled Vital Signs Assessment: post-procedure vital signs reviewed and stable Respiratory status: spontaneous breathing, nonlabored ventilation, respiratory function stable and patient connected to nasal cannula oxygen Cardiovascular status: blood pressure returned to baseline and stable Postop Assessment: no apparent nausea or vomiting Anesthetic complications: no    Adele Barthel Erving Sassano

## 2019-02-15 ENCOUNTER — Encounter: Payer: Self-pay | Admitting: Gastroenterology

## 2019-02-16 ENCOUNTER — Encounter: Payer: Self-pay | Admitting: Gastroenterology

## 2019-10-10 IMAGING — MG MM DIGITAL SCREENING BILAT W/ TOMO W/ CAD
8 series · 8 of 24 positions shown · non-contrast
Comparison: Previous exam(s).

CLINICAL DATA: Screening.

EXAM:
DIGITAL SCREENING BILATERAL MAMMOGRAM WITH TOMO AND CAD

[L CC synth-2D]
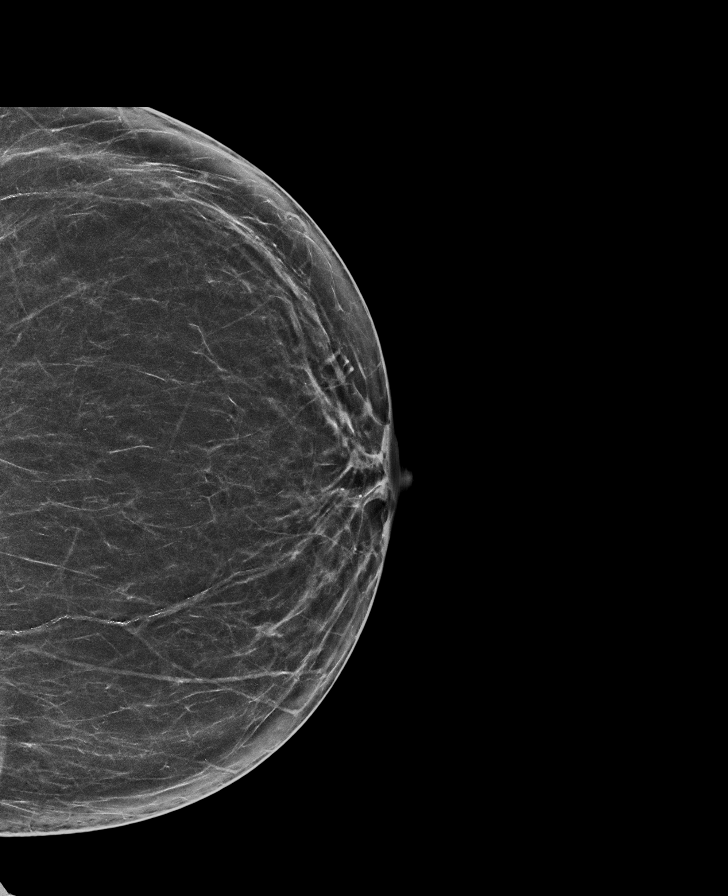

[R CC synth-2D]
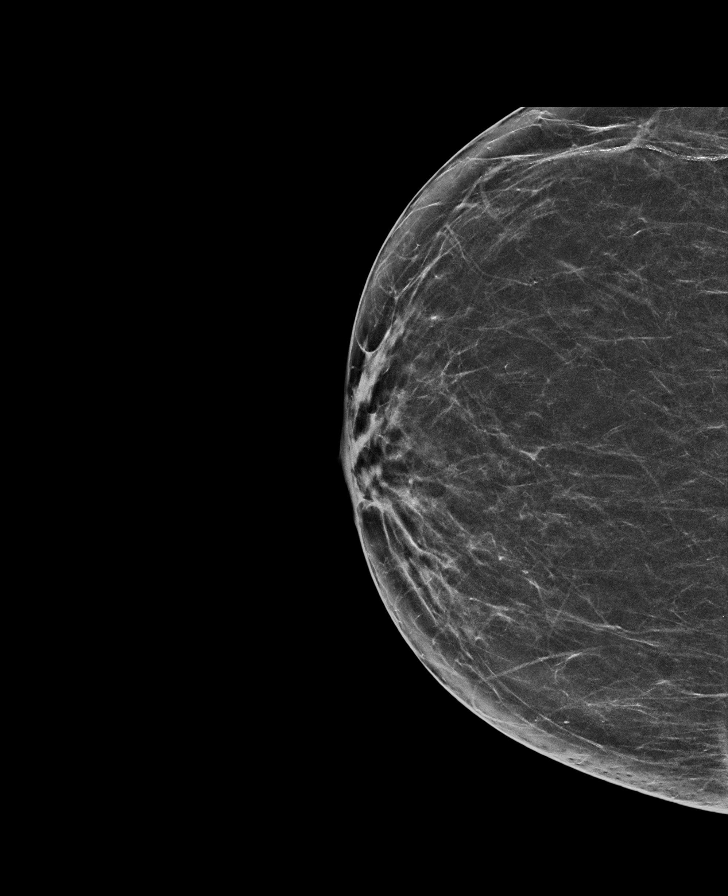

[L MLO synth-2D]
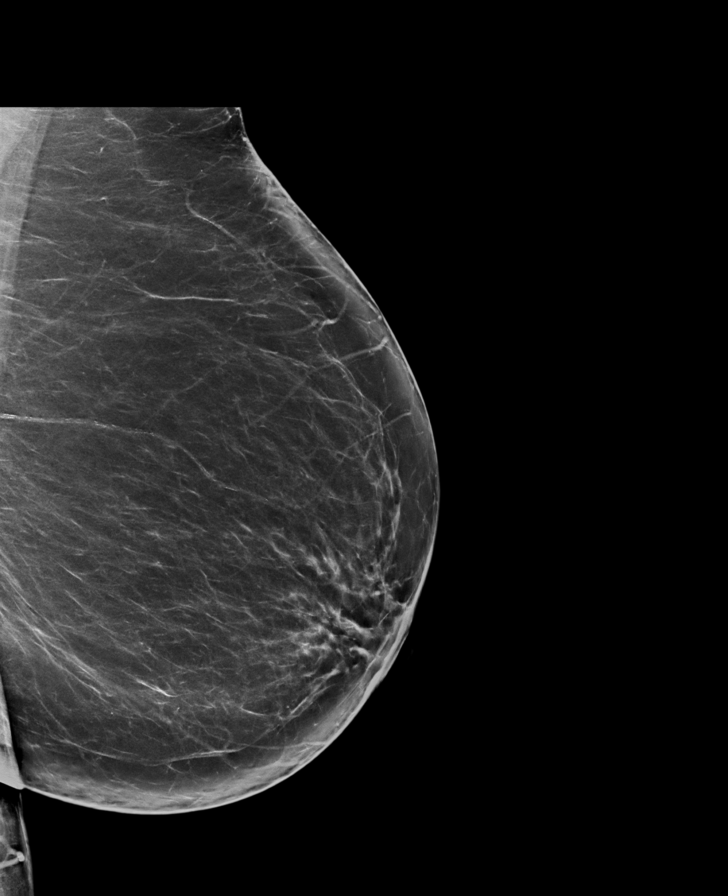

[R MLO synth-2D]
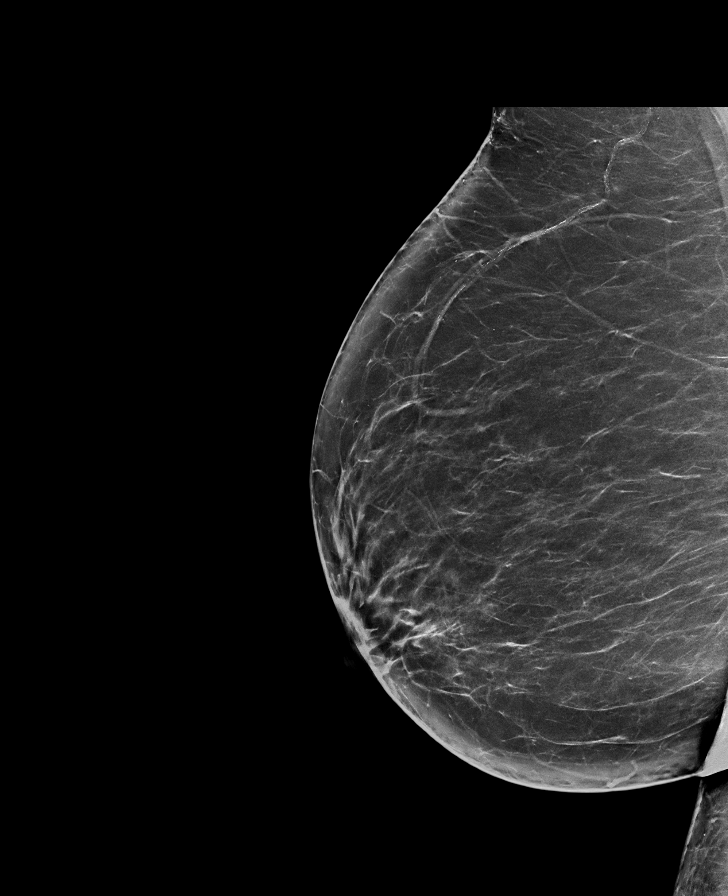

[R CC tomo · tomo slice 33/65.0]
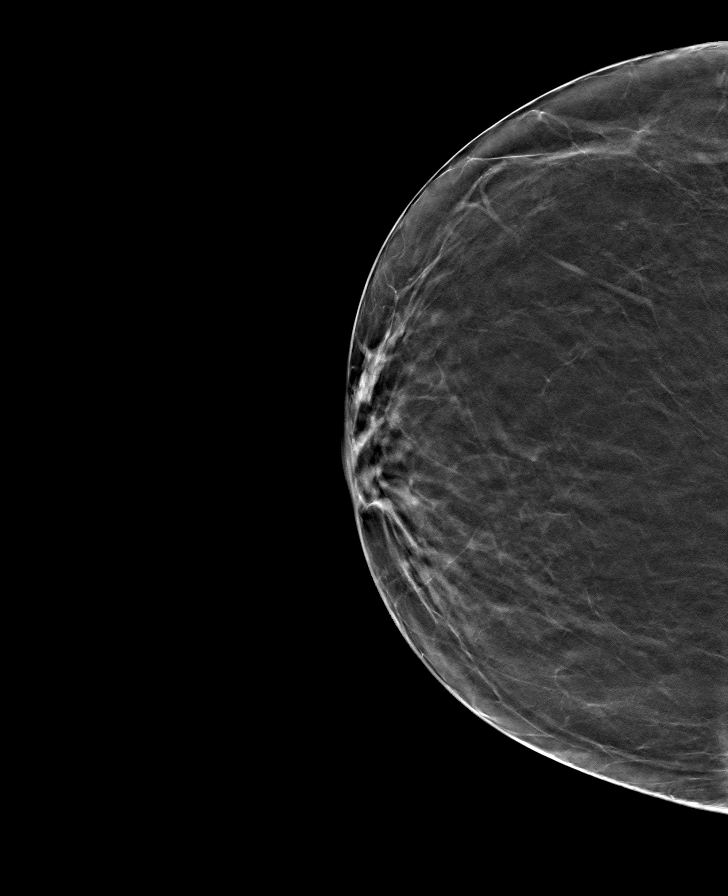

[L MLO tomo · tomo slice 43/86.0]
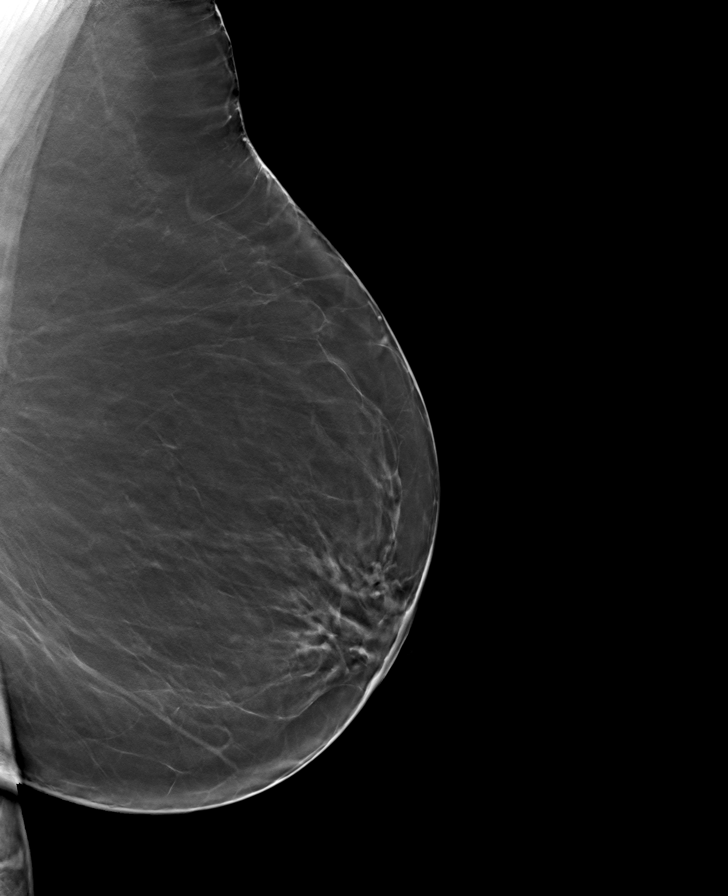

[L CC tomo · tomo slice 35/68.0]
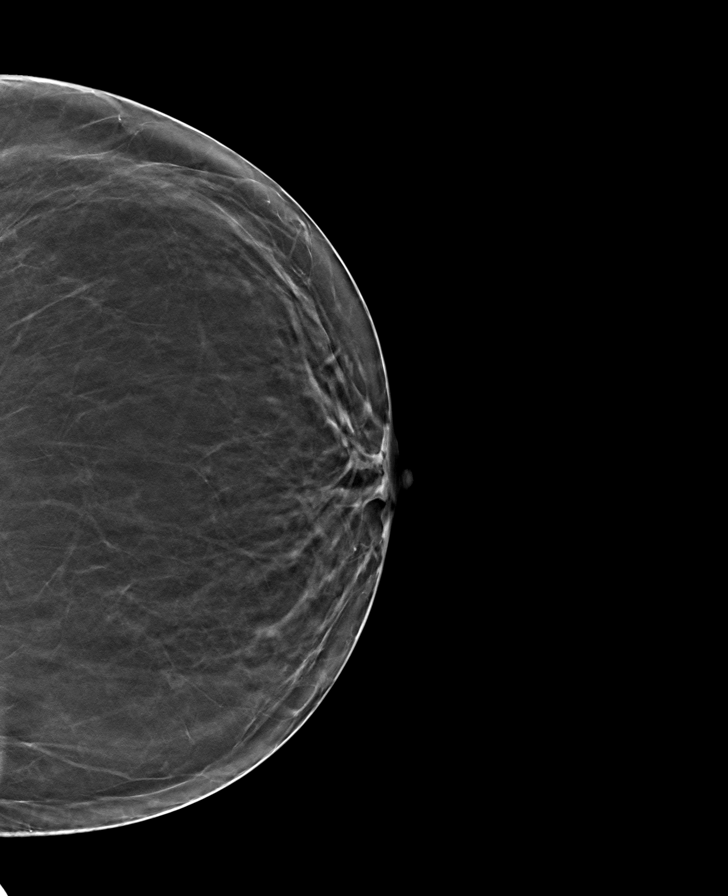

[R MLO tomo · tomo slice 41/81.0]
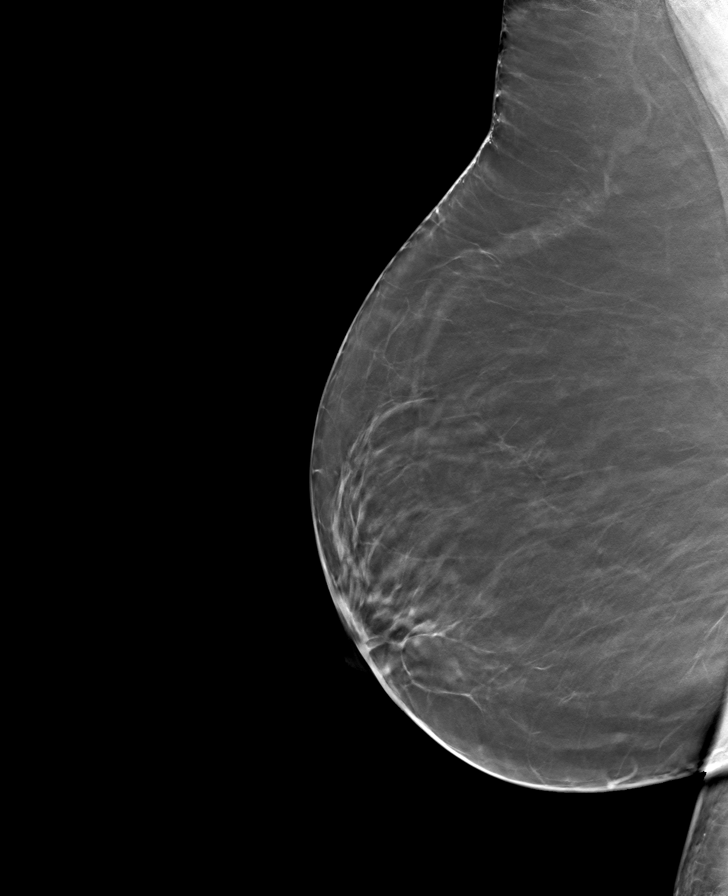

[8 of 24 positions shown; findings below may reference images not displayed]

ACR Breast Density Category b: There are scattered areas of
fibroglandular density.
FINDINGS: There are no findings suspicious for malignancy. Images were
processed with CAD.
IMPRESSION: No mammographic evidence of malignancy. A result letter of this
screening mammogram will be mailed directly to the patient.

RECOMMENDATION:
Screening mammogram in one year. (Code:CN-U-775)

BI-RADS CATEGORY  1: Negative.

## 2019-12-22 ENCOUNTER — Other Ambulatory Visit: Payer: Self-pay | Admitting: Nurse Practitioner

## 2019-12-22 DIAGNOSIS — Z1231 Encounter for screening mammogram for malignant neoplasm of breast: Secondary | ICD-10-CM

## 2019-12-22 DIAGNOSIS — R161 Splenomegaly, not elsewhere classified: Secondary | ICD-10-CM

## 2019-12-30 ENCOUNTER — Ambulatory Visit
Admission: RE | Admit: 2019-12-30 | Discharge: 2019-12-30 | Disposition: A | Discharge status: Home / Self Care | Payer: Medicare Other | Source: Ambulatory Visit | Attending: Nurse Practitioner | Admitting: Nurse Practitioner

## 2019-12-30 ENCOUNTER — Other Ambulatory Visit: Payer: Self-pay

## 2019-12-30 DIAGNOSIS — R161 Splenomegaly, not elsewhere classified: Secondary | ICD-10-CM | POA: Diagnosis present

## 2020-02-14 ENCOUNTER — Other Ambulatory Visit: Payer: Self-pay

## 2020-02-14 ENCOUNTER — Ambulatory Visit
Admission: RE | Admit: 2020-02-14 | Discharge: 2020-02-14 | Disposition: A | Discharge status: Home / Self Care | Payer: Medicare Other | Source: Ambulatory Visit | Attending: Nurse Practitioner | Admitting: Nurse Practitioner

## 2020-02-14 DIAGNOSIS — Z1231 Encounter for screening mammogram for malignant neoplasm of breast: Secondary | ICD-10-CM | POA: Insufficient documentation

## 2020-08-02 IMAGING — US US ABDOMEN LIMITED
1 series · 14 of 14 positions shown · non-contrast
Comparison: September 16, 2016

CLINICAL DATA: Splenomegaly

EXAM:
ULTRASOUND SPLEEN

[Series 1: us abdomen limited · 0.23mm/px · 14 of 14 slices shown]
[im 1/14]
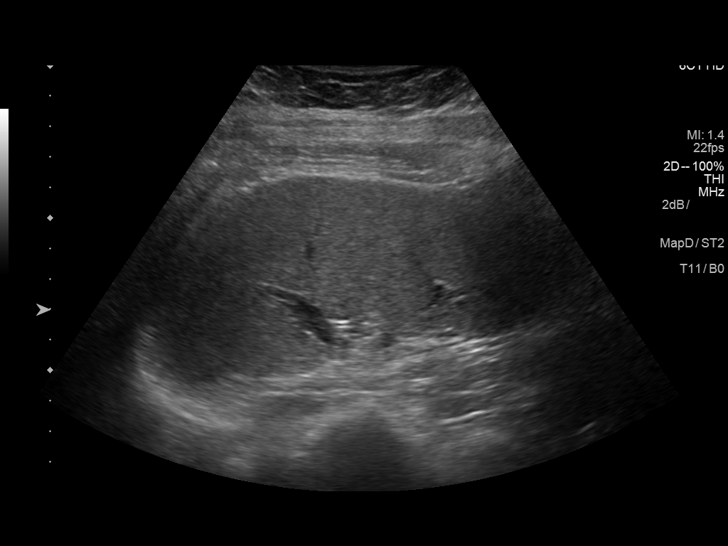
[im 2/14]
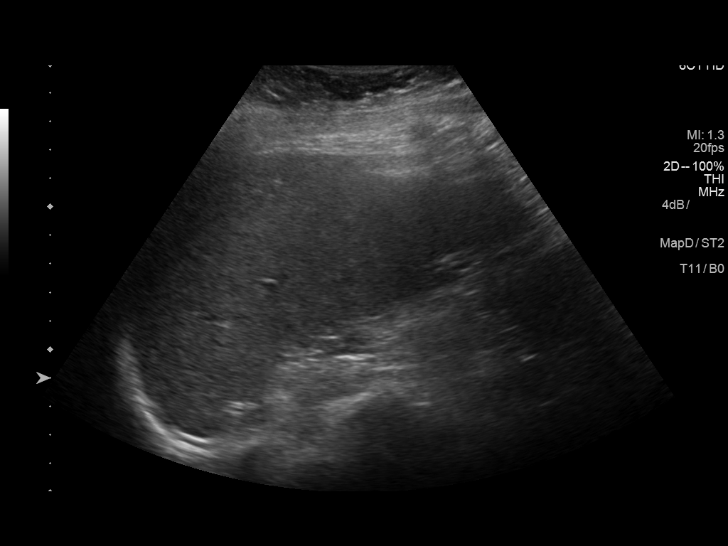
[im 3/14]
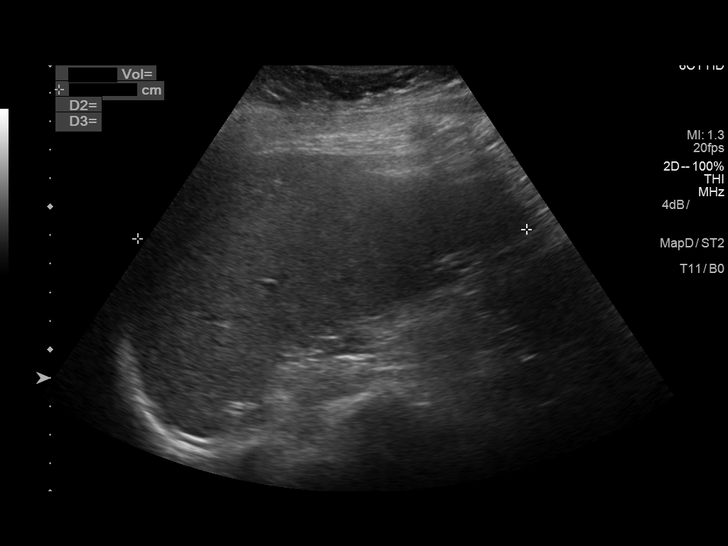
[im 4/14]
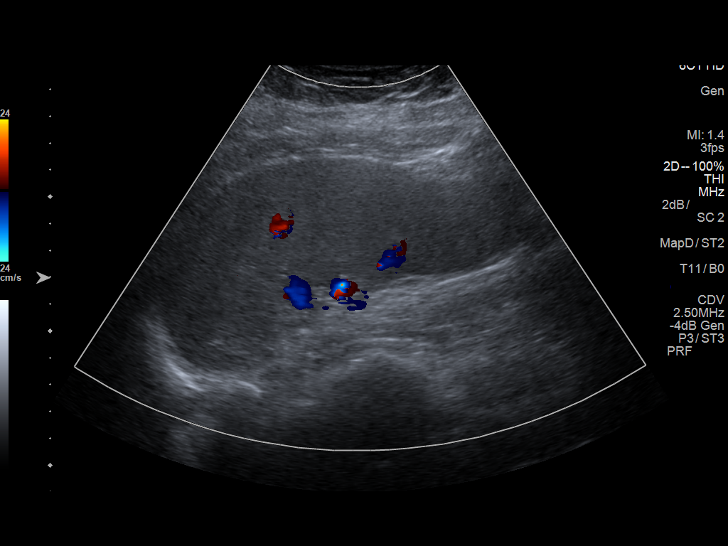
[im 5/14]
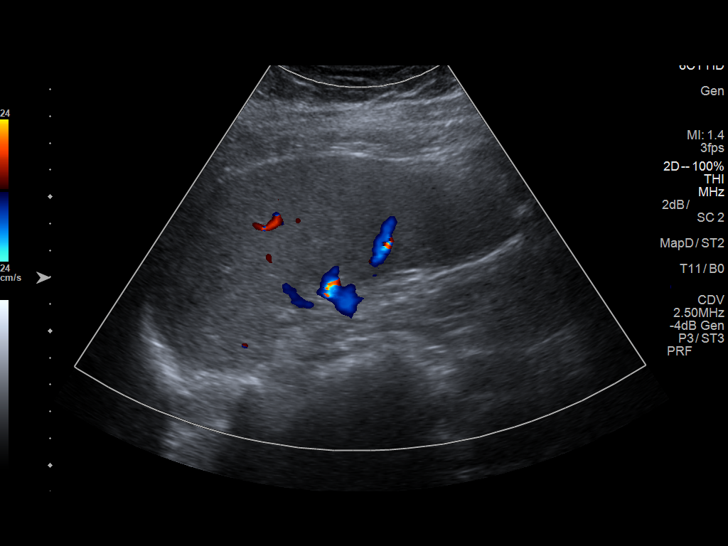
[im 6/14]
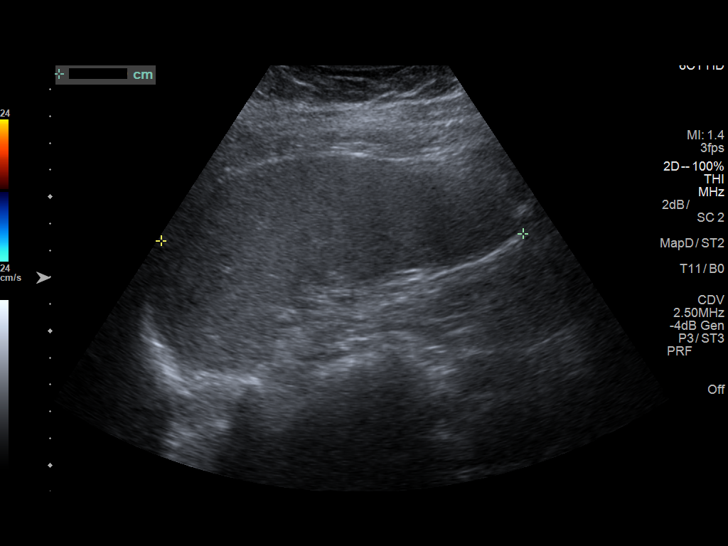
[im 7/14]
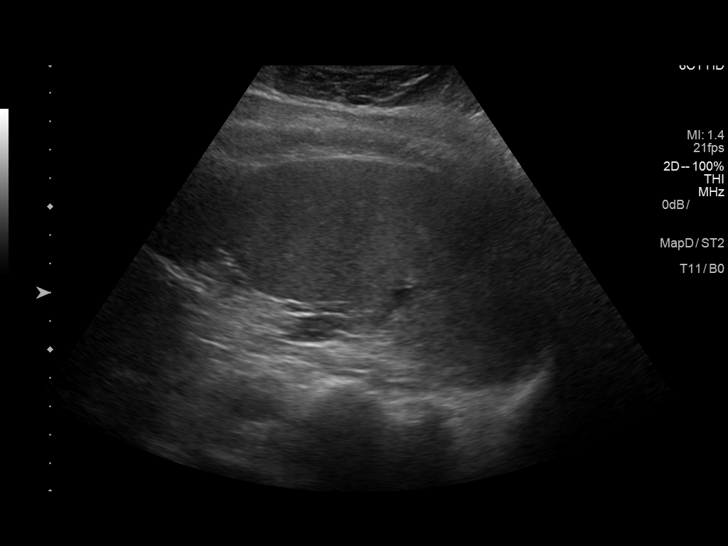
[im 8/14]
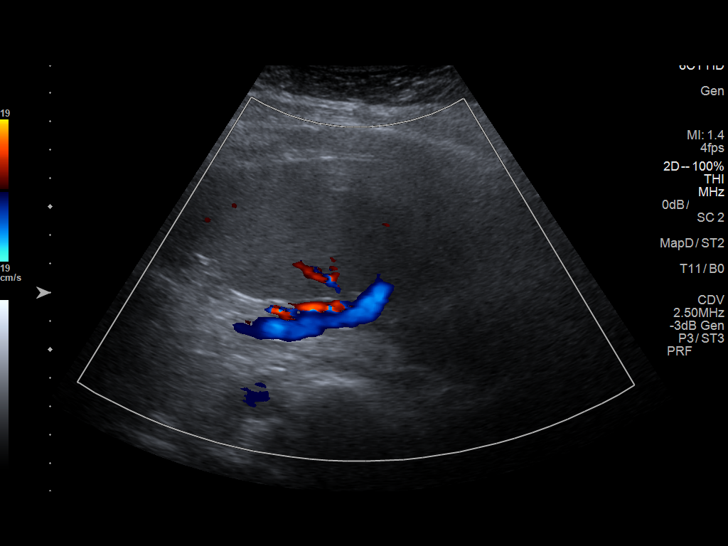
[im 9/14]
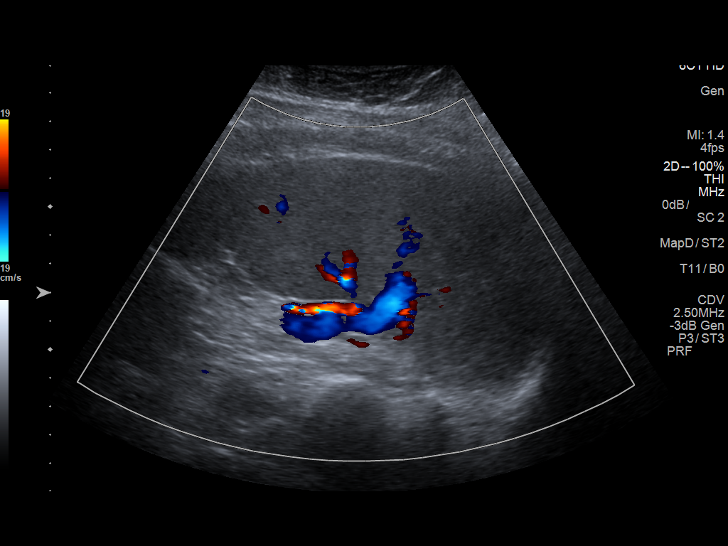
[im 10/14]
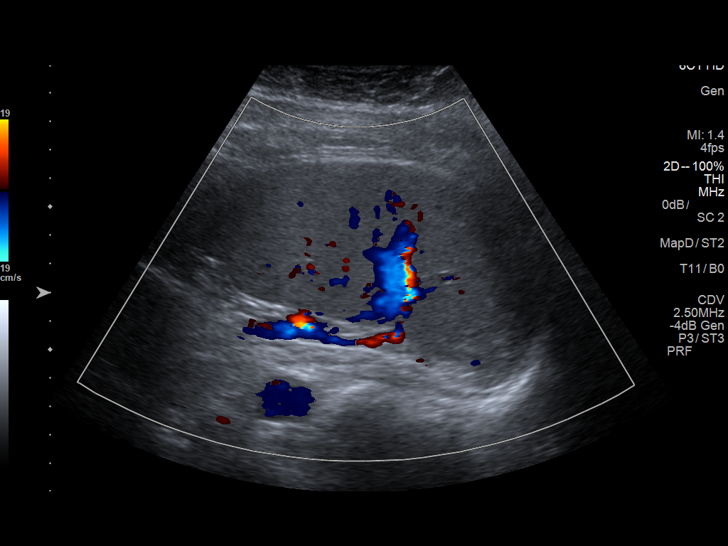
[im 11/14]
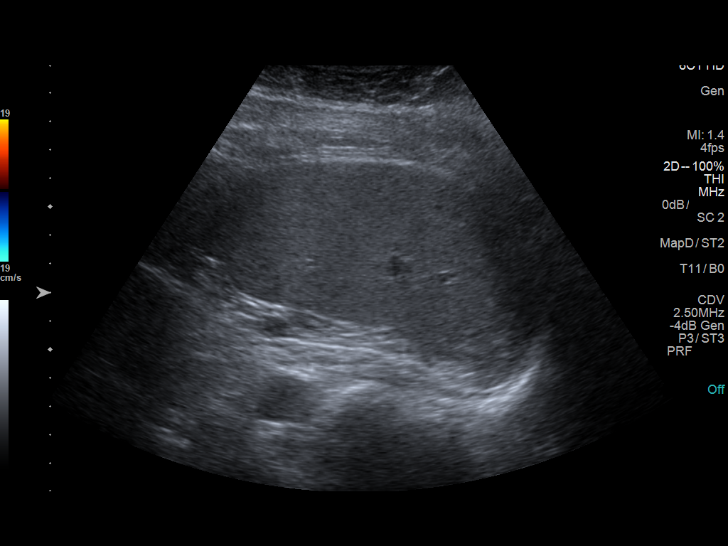
[im 12/14]
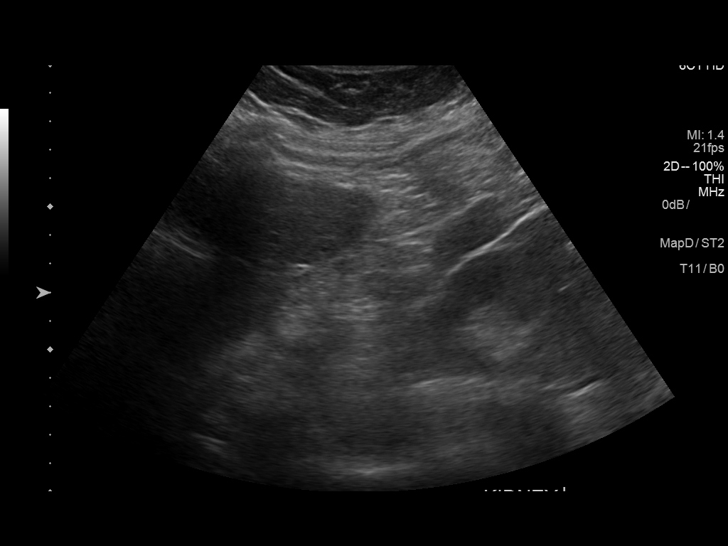
[im 13/14]
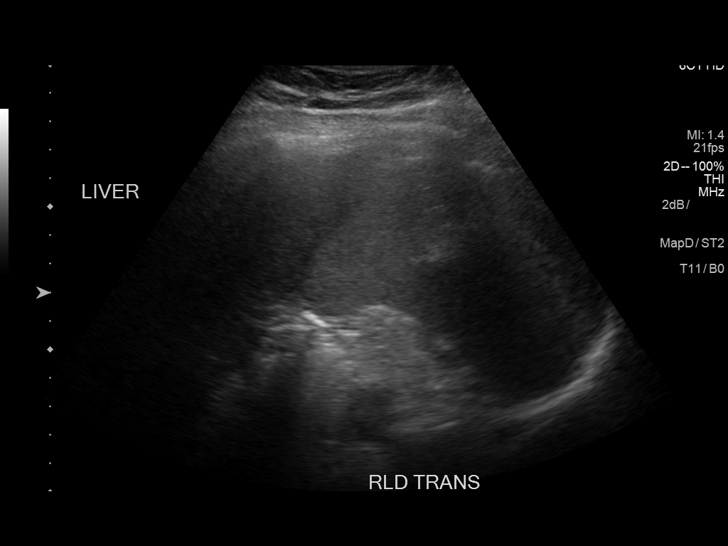
[im 14/14]
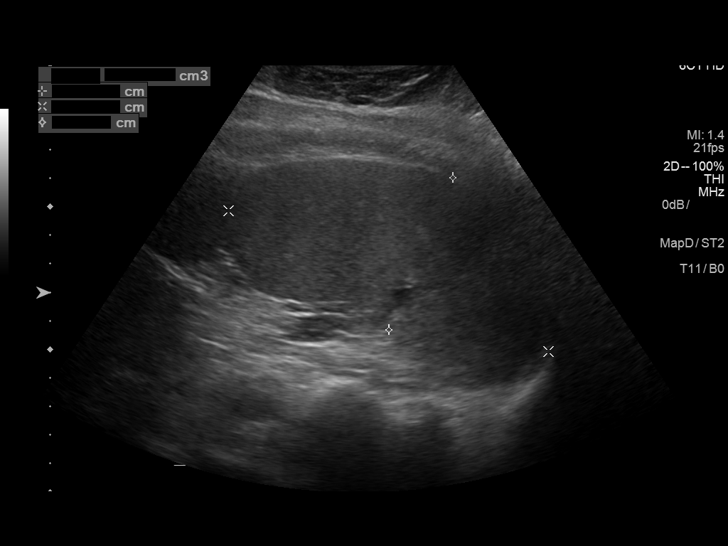

[14 of 14 positions shown; findings below may reference images not displayed]

FINDINGS: Spleen measures 13.6 x 12.3 x 5.8 cm with a measured splenic volume
of 505 cubic cm. Spleen is slightly larger than on previous study.
No focal splenic lesions evident. No perisplenic fluid or
adenopathy.
IMPRESSION: Prominent spleen, slightly larger than on prior study. No focal
splenic lesions evident. No perisplenic fluid or adenopathy.

## 2020-10-25 ENCOUNTER — Other Ambulatory Visit: Payer: Self-pay

## 2020-10-25 ENCOUNTER — Ambulatory Visit
Admission: EM | Admit: 2020-10-25 | Discharge: 2020-10-25 | Disposition: A | Discharge status: Home / Self Care | Payer: Medicare Other | Attending: Family Medicine | Admitting: Family Medicine

## 2020-10-25 DIAGNOSIS — B9789 Other viral agents as the cause of diseases classified elsewhere: Secondary | ICD-10-CM | POA: Diagnosis not present

## 2020-10-25 DIAGNOSIS — Z20822 Contact with and (suspected) exposure to covid-19: Secondary | ICD-10-CM | POA: Insufficient documentation

## 2020-10-25 DIAGNOSIS — J029 Acute pharyngitis, unspecified: Secondary | ICD-10-CM | POA: Diagnosis present

## 2020-10-25 DIAGNOSIS — Z87891 Personal history of nicotine dependence: Secondary | ICD-10-CM | POA: Insufficient documentation

## 2020-10-25 LAB — GROUP A STREP BY PCR: Group A Strep by PCR: NOT DETECTED

## 2020-10-25 MED ORDER — LIDOCAINE VISCOUS HCL 2 % MT SOLN: 15.0000 mL | OROMUCOSAL | 0 refills | Status: DC | PRN

## 2020-10-25 NOTE — ED Provider Notes (Signed)
[facility]    CSN: [id number] Arrival date & time: 10/25/20  1320      History   Chief Complaint Chief Complaint  Patient presents with  . Sore Throat    HPI Jacqueline Chavez is a 60 y.o. female presenting for severe sore throat and painful swallowing that started this morning. She is denying any other symptoms.  Patient denies fever, fatigue, cough, congestion, breathing difficulty, vomiting or diarrhea.  She denies any sick contacts and no known exposure to strep, influenza or COVID-19.  Patient denies receiving any COVID vaccinations.  She has not tried any over-the-counter medication for symptoms.  Past medical history significant for diabetes.  No other concerns.  HPI  Past Medical History:  Diagnosis Date  . Diabetes mellitus without complication ([place])   . GERD (gastroesophageal reflux disease)   . Gout   . Hyperlipidemia     Patient Active Problem List   Diagnosis Date Noted  . Special screening for malignant neoplasms, colon   . Polyp of transverse colon     Past Surgical History:  Procedure Laterality Date  . COLONOSCOPY WITH PROPOFOL N/A 02/12/2019   Procedure: COLONOSCOPY WITH PROPOFOL;  Surgeon: Lucilla Lame, MD;  Location: [place];  Service: Endoscopy;  Laterality: N/A;  Diabetic  . PARTIAL HYSTERECTOMY    . POLYPECTOMY  02/12/2019   Procedure: POLYPECTOMY;  Surgeon: Lucilla Lame, MD;  Location: [place];  Service: Endoscopy;;    OB History   No obstetric history on file.      Home Medications    Prior to Admission medications   Medication Sig Start Date End Date Taking? Authorizing Provider  allopurinol (ZYLOPRIM) 100 MG tablet Take 50 mg by mouth daily.   Yes [provider]  aspirin EC 81 MG tablet Take 81 mg by mouth daily.   Yes [provider]  atorvastatin (LIPITOR) 10 MG tablet Take 20 mg by mouth daily.   Yes [provider]  Cholecalciferol (VITAMIN D) 50 MCG (2000 UT) CAPS Take  by mouth.   Yes [provider]  furosemide (LASIX) 20 MG tablet Take 20 mg by mouth.   Yes [provider]  glipiZIDE (GLUCOTROL) 10 MG tablet Take 10 mg by mouth daily before breakfast.   Yes [provider]  Insulin Degludec (TRESIBA [place]) Inject 30 Units into the skin daily.   Yes [provider]  lansoprazole (PREVACID) 30 MG capsule Take 30 mg by mouth daily at 12 noon.   Yes [provider]  lidocaine (XYLOCAINE) 2 % solution Use as directed 15 mLs in the mouth or throat every 3 (three) hours as needed for mouth pain (swish and spit). 10/25/20  Yes Danton Clap, PA-C  Semaglutide (OZEMPIC, 1 MG/DOSE, [place]) Inject 5 Units into the skin once a week.   Yes [provider]  sitaGLIPtin (JANUVIA) 100 MG tablet Take 100 mg by mouth daily.   Yes [provider]    Family History Family History  Problem Relation Age of Onset  . Breast cancer Neg Hx     Social History Social History   Tobacco Use  . Smoking status: Former Research scientist (life sciences)  . Smokeless tobacco: Never Used  Vaping Use  . Vaping Use: Never used  Substance Use Topics  . Alcohol use: Not Currently  . Drug use: Never     Allergies   Patient has no known allergies.   Review of Systems Review of Systems  Constitutional: Negative for  chills, diaphoresis, fatigue and fever.  HENT: Positive for sore throat. Negative for congestion, ear pain, rhinorrhea, sinus pressure and sinus pain.   Respiratory: Negative for cough and shortness of breath.   Gastrointestinal: Negative for abdominal pain, nausea and vomiting.  Musculoskeletal: Negative for arthralgias and myalgias.  Skin: Negative for rash.  Neurological: Negative for weakness and headaches.  Hematological: Negative for adenopathy.     Physical Exam Triage Vital Signs ED Triage Vitals  Enc Vitals Group     BP 10/25/20 1402 140/60     Pulse Rate 10/25/20 1402 88     Resp 10/25/20 1402 18     Temp 10/25/20 1402  98.2 F (36.8 C)     Temp Source 10/25/20 1402 Oral     SpO2 10/25/20 1402 100 %     Weight 10/25/20 1401 257 lb (116.6 kg)     Height 10/25/20 1401 5\' 3"  (1.6 m)     Head Circumference --      Peak Flow --      Pain Score 10/25/20 1400 8     Pain Loc --      Pain Edu? --      Excl. in [place]? --    No data found.  Updated Vital Signs BP 140/60 (BP Location: Left Arm)   Pulse 88   Temp 98.2 F (36.8 C) (Oral)   Resp 18   Ht 5\' 3"  (1.6 m)   Wt 257 lb (116.6 kg)   LMP 10/20/2014 (Approximate)   SpO2 100%   BMI 45.53 kg/m      Physical Exam Vitals and nursing note reviewed.  Constitutional:      General: She is not in acute distress.    Appearance: Normal appearance. She is well-developed. She is obese. She is not ill-appearing or toxic-appearing.  HENT:     Head: Normocephalic and atraumatic.     Nose: Nose normal.     Mouth/Throat:     Mouth: Mucous membranes are moist.     Pharynx: Oropharynx is clear. Posterior oropharyngeal erythema present.  Eyes:     General: No scleral icterus.       Right eye: No discharge.        Left eye: No discharge.     Conjunctiva/sclera: Conjunctivae normal.  Cardiovascular:     Rate and Rhythm: Normal rate and regular rhythm.     Heart sounds: Normal heart sounds.  Pulmonary:     Effort: Pulmonary effort is normal. No respiratory distress.     Breath sounds: Normal breath sounds.  Musculoskeletal:     Cervical back: Neck supple.  Skin:    General: Skin is dry.  Neurological:     General: No focal deficit present.     Mental Status: She is alert. Mental status is at baseline.     Motor: No weakness.     Gait: Gait normal.  Psychiatric:        Mood and Affect: Mood normal.        Behavior: Behavior normal.        Thought Content: Thought content normal.      UC Treatments / Results  Labs (all labs ordered are listed, but only abnormal results are displayed) Labs Reviewed  GROUP A STREP BY PCR  SARS CORONAVIRUS 2 (TAT  6-24 HRS)    EKG   Radiology No results found.  Procedures Procedures (including critical care time)  Medications Ordered in UC Medications - No data to display  Initial Impression / Assessment and Plan / UC Course  I have reviewed the triage vital signs and the nursing notes.  Pertinent labs & imaging results that were available during my care of the patient were reviewed by me and considered in my medical decision making (see chart for details).   60 year old female presenting for sore throat starting today.  All vital signs normal and stable.  She is overall well-appearing.  Mild posterior pharyngeal erythema on exam.  The remainder of the exam is normal.  Molecular strep test is negative.  COVID test performed.  Current CDC guidelines, isolation protocol and ED precautions reviewed patient.  Advised supportive care with OTC Chloraseptic spray, Tylenol, and I have sent in viscous lidocaine.  Advised her that her symptoms just started today so she may develop some other symptoms such as cough or congestion since this is most likely a viral illness.  Then, I would advise over-the-counter Mucinex and increasing fluids and to follow-up for any fever, severe cough or breathing problem.   Final Clinical Impressions(s) / UC Diagnoses   Final diagnoses:  Viral pharyngitis     Discharge Instructions     URI/COLD SYMPTOMS: Your exam today is consistent with a viral illness. Antibiotics are not indicated at this time. Use medications as directed, including cough syrup, nasal saline, and decongestants. Your symptoms should improve over the next few days and resolve within 7-10 days. Increase rest and fluids. F/u if symptoms worsen or predominate such as sore throat, ear pain, productive cough, shortness of breath, or if you develop high fevers or worsening fatigue over the next several days.    You have received COVID testing today either for positive exposure, concerning symptoms that  could be related to COVID infection, screening purposes, or re-testing after confirmed positive.  Your test obtained today checks for active viral infection in the last 1-2 weeks. If your test is negative now, you can still test positive later. So, if you do develop symptoms you should either get re-tested and/or isolate x 5 days and then strict mask use x 5 days (unvaccinated) or mask use x 10 days (vaccinated). Please follow CDC guidelines.  While Rapid antigen tests come back in 15-20 minutes, send out PCR/molecular test results typically come back within 1-3 days. In the mean time, if you are symptomatic, assume this could be a positive test and treat/monitor yourself as if you do have COVID.   We will call with test results if positive. Please download the MyChart app and set up a profile to access test results.   If symptomatic, go home and rest. Push fluids. Take Tylenol as needed for discomfort. Gargle warm salt water. Throat lozenges. Take Mucinex DM or Robitussin for cough. Humidifier in bedroom to ease coughing. Warm showers. Also review the COVID handout for more information.  COVID-19 INFECTION: The incubation period of COVID-19 is approximately 14 days after exposure, with most symptoms developing in roughly 4-5 days. Symptoms may range in severity from mild to critically severe. Roughly 80% of those infected will have mild symptoms. People of any age may become infected with COVID-19 and have the ability to transmit the virus. The most common symptoms include: fever, fatigue, cough, body aches, headaches, sore throat, nasal congestion, shortness of breath, nausea, vomiting, diarrhea, changes in smell and/or taste.    COURSE OF ILLNESS Some patients may begin with mild disease which can progress quickly into critical symptoms. If your symptoms are worsening please call ahead to the Emergency  Department and proceed there for further treatment. Recovery time appears to be roughly 1-2 weeks for  mild symptoms and 3-6 weeks for severe disease.   GO IMMEDIATELY TO ER FOR FEVER YOU ARE UNABLE TO GET DOWN WITH TYLENOL, BREATHING PROBLEMS, CHEST PAIN, FATIGUE, LETHARGY, INABILITY TO EAT OR DRINK, ETC  QUARANTINE AND ISOLATION: To help decrease the spread of COVID-19 please remain isolated if you have COVID infection or are highly suspected to have COVID infection. This means -stay home and isolate to one room in the home if you live with others. Do not share a bed or bathroom with others while ill, sanitize and wipe down all countertops and keep common areas clean and disinfected. Stay home for 5 days. If you have no symptoms or your symptoms are resolving after 5 days, you can leave your house. Continue to wear a mask around others for 5 additional days. If you have been in close contact (within 6 feet) of someone diagnosed with COVID 19, you are advised to quarantine in your home for 14 days as symptoms can develop anywhere from 2-14 days after exposure to the virus. If you develop symptoms, you  must isolate.  Most current guidelines for COVID after exposure -unvaccinated: isolate 5 days and strict mask use x 5 days. Test on day 5 is possible -vaccinated: wear mask x 10 days if symptoms do not develop -You do not necessarily need to be tested for COVID if you have + exposure and  develop symptoms. Just isolate at home x10 days from symptom onset During this global pandemic, CDC advises to practice social distancing, try to stay at least 12ft away from others at all times. Wear a face covering. Wash and sanitize your hands regularly and avoid going anywhere that is not necessary.  KEEP IN MIND THAT THE COVID TEST IS NOT 100% ACCURATE AND YOU SHOULD STILL DO EVERYTHING TO PREVENT POTENTIAL SPREAD OF VIRUS TO OTHERS (WEAR MASK, WEAR GLOVES, [place] HANDS AND SANITIZE REGULARLY). IF INITIAL TEST IS NEGATIVE, THIS MAY NOT MEAN YOU ARE DEFINITELY NEGATIVE. MOST ACCURATE TESTING IS DONE 5-7 DAYS AFTER  EXPOSURE.   It is not advised by CDC to get re-tested after receiving a positive COVID test since you can still test positive for weeks to months after you have already cleared the virus.   *If you have not been vaccinated for COVID, I strongly suggest you consider getting vaccinated as long as there are no contraindications.      ED Prescriptions    Medication Sig Dispense Auth. Provider   lidocaine (XYLOCAINE) 2 % solution Use as directed 15 mLs in the mouth or throat every 3 (three) hours as needed for mouth pain (swish and spit). 100 mL Danton Clap, PA-C     PDMP not reviewed this encounter.   Danton Clap, PA-C 10/25/20 1519

## 2020-10-25 NOTE — Discharge Instructions (Signed)

## 2020-10-25 NOTE — ED Triage Notes (Signed)
Patient states that she woke up this morning with a severe sore throat.

## 2020-10-26 LAB — SARS CORONAVIRUS 2 (TAT 6-24 HRS): SARS Coronavirus 2: NEGATIVE

## 2021-01-05 ENCOUNTER — Encounter: Payer: Self-pay | Admitting: Emergency Medicine

## 2021-01-05 ENCOUNTER — Ambulatory Visit
Admission: EM | Admit: 2021-01-05 | Discharge: 2021-01-05 | Disposition: A | Discharge status: Home / Self Care | Payer: Medicare Other | Attending: Family Medicine | Admitting: Family Medicine

## 2021-01-05 ENCOUNTER — Other Ambulatory Visit: Payer: Self-pay

## 2021-01-05 ENCOUNTER — Emergency Department
Admission: EM | Admit: 2021-01-05 | Discharge: 2021-01-05 | Disposition: A | Discharge status: Home / Self Care | Payer: Medicare Other | Attending: Emergency Medicine | Admitting: Emergency Medicine

## 2021-01-05 DIAGNOSIS — T783XXA Angioneurotic edema, initial encounter: Secondary | ICD-10-CM | POA: Diagnosis present

## 2021-01-05 DIAGNOSIS — E119 Type 2 diabetes mellitus without complications: Secondary | ICD-10-CM | POA: Insufficient documentation

## 2021-01-05 DIAGNOSIS — Z7984 Long term (current) use of oral hypoglycemic drugs: Secondary | ICD-10-CM | POA: Diagnosis not present

## 2021-01-05 DIAGNOSIS — I10 Essential (primary) hypertension: Secondary | ICD-10-CM | POA: Insufficient documentation

## 2021-01-05 DIAGNOSIS — Z7982 Long term (current) use of aspirin: Secondary | ICD-10-CM | POA: Diagnosis not present

## 2021-01-05 DIAGNOSIS — Z87891 Personal history of nicotine dependence: Secondary | ICD-10-CM | POA: Diagnosis not present

## 2021-01-05 DIAGNOSIS — Z79899 Other long term (current) drug therapy: Secondary | ICD-10-CM | POA: Insufficient documentation

## 2021-01-05 DIAGNOSIS — Z794 Long term (current) use of insulin: Secondary | ICD-10-CM | POA: Insufficient documentation

## 2021-01-05 MED ORDER — METHYLPREDNISOLONE SODIUM SUCC 125 MG IJ SOLR: 125.0000 mg | Freq: Once | INTRAMUSCULAR | Status: DC

## 2021-01-05 MED ORDER — FAMOTIDINE IN NACL 20-0.9 MG/50ML-% IV SOLN
20.0000 mg | Freq: Once | INTRAVENOUS | Status: AC
  Administered 2021-01-05: 20 mg via INTRAVENOUS
  Filled 2021-01-05: qty 50

## 2021-01-05 MED ORDER — DIPHENHYDRAMINE HCL 50 MG/ML IJ SOLN
50.0000 mg | Freq: Once | INTRAMUSCULAR | Status: AC
  Administered 2021-01-05: 50 mg via INTRAVENOUS

## 2021-01-05 MED ORDER — EPINEPHRINE 0.3 MG/0.3ML IJ SOAJ
0.3000 mg | Freq: Once | INTRAMUSCULAR | Status: AC
  Administered 2021-01-05: 0.3 mg via INTRAMUSCULAR
  Filled 2021-01-05: qty 0.3

## 2021-01-05 MED ORDER — METHYLPREDNISOLONE SODIUM SUCC 125 MG IJ SOLR
125.0000 mg | Freq: Once | INTRAMUSCULAR | Status: AC
  Administered 2021-01-05: 125 mg via INTRAVENOUS

## 2021-01-05 MED ORDER — DIPHENHYDRAMINE HCL 50 MG/ML IJ SOLN: 50.0000 mg | Freq: Once | INTRAMUSCULAR | Status: DC

## 2021-01-05 NOTE — ED Triage Notes (Signed)
Pt comes into the ED via [facility] from [facility] clinic for swelling of the tongue.  Pt has improved speech, and no difficulty breathing, no rash, does take an ACE inhibitor for long term use.  Pt states she had the swelling of the tongue 2 hrs after eating lunch.  No known allergies.  Pt A&Ox4. Last lisinopril taken at 8:00am this morning.   22 L AC 125 solumedrol given at 17:45 50 mg benadryl given at 17:45 Initial 178/92 88 HR 100 % RA last BP 146/72

## 2021-01-05 NOTE — ED Triage Notes (Signed)
Patient states that she ate a hot dog and her tongue started swelling about 2 hours ago.

## 2021-01-05 NOTE — ED Notes (Addendum)
Dr. Tamala Julian notified of patient.

## 2021-01-05 NOTE — ED Provider Notes (Signed)
[facility] Emergency Department Provider Note  ____________________________________________   Event Date/Time   First MD Initiated Contact with Patient 01/05/21 1856     (approximate)  I have reviewed the triage vital signs and the nursing notes.   HISTORY  Chief Complaint Angioedema   HPI Jacqueline Chavez is a 60 y.o. female with a past medical history of DM, gout, HDL, GERD and HTN on lisinopril who presents referred from urgent care with concerns for acute angioedema of the tongue.  Patient states she noticed her tongue was swelling as well as her lower lip shortly after lunch.  She states she ate a hotdog and ventriculitis that she has never had any issues with hypoxia 4.  She has been on her lisinopril for many years.  She denies any other areas of swelling.  She denies any shortness of breath or cough, rash, itching, nausea, vomiting, diarrhea or other clear associated sick symptoms.  She states he feels it is gotten better since he received some Benadryl and steroids at urgent care.  She denies any other recent sick symptoms including headache, earache, sore throat, nausea, vomiting, diarrhea, dysuria, rash, fevers, chills, chest pain, [place] pain or prior similar episodes         Past Medical History:  Diagnosis Date   Diabetes mellitus without complication ([place])    GERD (gastroesophageal reflux disease)    Gout    Hyperlipidemia     Patient Active Problem List   Diagnosis Date Noted   Special screening for malignant neoplasms, colon    Polyp of transverse colon     Past Surgical History:  Procedure Laterality Date   COLONOSCOPY WITH PROPOFOL N/A 02/12/2019   Procedure: COLONOSCOPY WITH PROPOFOL;  Surgeon: Lucilla Lame, MD;  Location: [place];  Service: Endoscopy;  Laterality: N/A;  Diabetic   PARTIAL HYSTERECTOMY     POLYPECTOMY  02/12/2019   Procedure: POLYPECTOMY;  Surgeon: Lucilla Lame, MD;  Location: [place];   Service: Endoscopy;;    Prior to Admission medications   Medication Sig Start Date End Date Taking? Authorizing Provider  allopurinol (ZYLOPRIM) 100 MG tablet Take 50 mg by mouth daily.    [provider]  aspirin EC 81 MG tablet Take 81 mg by mouth daily.    [provider]  atorvastatin (LIPITOR) 10 MG tablet Take 20 mg by mouth daily.    [provider]  Cholecalciferol (VITAMIN D) 50 MCG (2000 UT) CAPS Take by mouth.    [provider]  furosemide (LASIX) 20 MG tablet Take 20 mg by mouth.    [provider]  glipiZIDE (GLUCOTROL) 10 MG tablet Take 10 mg by mouth daily before breakfast.    [provider]  Insulin Degludec (TRESIBA [place]) Inject 30 Units into the skin daily.    [provider]  lansoprazole (PREVACID) 30 MG capsule Take 30 mg by mouth daily at 12 noon.    [provider]  lidocaine (XYLOCAINE) 2 % solution Use as directed 15 mLs in the mouth or throat every 3 (three) hours as needed for mouth pain (swish and spit). 10/25/20   Danton Clap, PA-C  Semaglutide (OZEMPIC, 1 MG/DOSE, [place]) Inject 5 Units into the skin once a week.    [provider]  sitaGLIPtin (JANUVIA) 100 MG tablet Take 100 mg by mouth daily.    [provider]    Allergies Patient has no known allergies.  Family History  Problem Relation  Age of Onset   Breast cancer Neg Hx     Social History Social History   Tobacco Use   Smoking status: Former   Smokeless tobacco: Never  Scientific laboratory technician Use: Never used  Substance Use Topics   Alcohol use: Not Currently   Drug use: Never    Review of Systems  Review of Systems  Constitutional:  Negative for chills and fever.  HENT:  Negative for sore throat.   Eyes:  Negative for pain.  Respiratory:  Negative for cough and stridor.   Cardiovascular:  Negative for chest pain.  Gastrointestinal:  Negative for vomiting.  Genitourinary:  Negative for dysuria.   Musculoskeletal:  Negative for myalgias.  Skin:  Negative for rash.  Neurological:  Negative for seizures, loss of consciousness and headaches.  Psychiatric/Behavioral:  Negative for suicidal ideas.   All other systems reviewed and are negative.    ____________________________________________   PHYSICAL EXAM:  VITAL SIGNS: ED Triage Vitals  Enc Vitals Group     BP 01/05/21 1848 130/72     Pulse Rate 01/05/21 1848 88     Resp 01/05/21 1848 16     Temp 01/05/21 1848 98 F (36.7 C)     Temp Source 01/05/21 1848 Oral     SpO2 01/05/21 1848 99 %     Weight 01/05/21 1849 255 lb 11.7 oz (116 kg)     Height 01/05/21 1849 '5\' 3"'$  (1.6 m)     Head Circumference --      Peak Flow --      Pain Score 01/05/21 1849 0     Pain Loc --      Pain Edu? --      Excl. in [place]? --    Vitals:   01/05/21 2100 01/05/21 2115  BP: 130/64   Pulse: 88 89  Resp: (!) 23 (!) 24  Temp:    SpO2: 100% 100%   Physical Exam Vitals and nursing note reviewed.  Constitutional:      General: She is not in acute distress.    Appearance: She is well-developed.  HENT:     Head: Normocephalic and atraumatic.     Right Ear: External ear normal.     Left Ear: External ear normal.     Nose: Nose normal.  Eyes:     Conjunctiva/sclera: Conjunctivae normal.  Cardiovascular:     Rate and Rhythm: Normal rate and regular rhythm.     Heart sounds: No murmur heard. Pulmonary:     Effort: Pulmonary effort is normal. No respiratory distress.     Breath sounds: Normal breath sounds.  Abdominal:     Palpations: Abdomen is soft.     Tenderness: There is no abdominal tenderness.  Musculoskeletal:     Cervical back: Neck supple.  Skin:    General: Skin is warm and dry.     Capillary Refill: Capillary refill takes less than 2 seconds.  Neurological:     Mental Status: She is alert and oriented to person, place, and time.    Lungs are clear bilaterally.  No stridor over the neck.  Significant edema of the tongue  and lower lips.  No significant edema upper lip.  Patient is awake and alert.  Cranial nerves II to XII grossly intact.  2+ radial pulse. ____________________________________________   LABS (all labs ordered are listed, but only abnormal results are displayed)  Labs Reviewed - No data to display ____________________________________________  EKG  Sinus rhythm with  a ventricular of 85, normal axis, unremarkable intervals with some artifact in lead III and lead II without clear evidence of acute ischemia. ____________________________________________  RADIOLOGY  ED MD interpretation:    Official radiology report(s): No results found.  ____________________________________________   PROCEDURES  Procedure(s) performed (including Critical Care):  .1-3 Lead EKG Interpretation  Date/Time: 01/05/2021 10:42 PM Performed by: Lucrezia Starch, MD Authorized by: Lucrezia Starch, MD     Interpretation: normal     ECG rate assessment: normal     Rhythm: sinus rhythm     Ectopy: none     Conduction: normal     ____________________________________________   INITIAL IMPRESSION / ASSESSMENT AND PLAN / ED COURSE      Patient presents with above-stated history and exam for assessment after she developed some swelling in her lower lip and tongue shortly after noon.  She was initially seen in urgent care where she received some Solu-Medrol and Benadryl.  She states she started feeling medially better after this.  On emergency room she is slight tachypneic otherwise stable vital signs on arrival.  She does have evidence of edema of the tongue and lower lip.  She has no stridor or abnormal breath sounds.  She denies any itchiness and has no evidence of a rash and denies any GI symptoms.  Suspect place induced angioedema given patient is on lisinopril.  However she was given a dose of epinephrine as well to treat possible anaphylaxis.  No clear triggers otherwise identified on history.  Advised  patient that she can longer take lisinopril other drugs in this category she was follow-up with her PCP on Monday to determine what will be an appropriate blood pressure medicine.  She was observed for over 4 hours on multiple reassessments and improving of the swelling of her lower lip and tongue.  On my 4-hour reassessment patient's edema has not completely resolved.  She is able to drink fluids and has no difficulty with this.  She states she was to go home.  Think this is reasonable.  Low suspicion for significant metabolic derangement or acute infectious process.  Advised to return immediately to emergency room if he experiences any worsening symptoms or recurrence.  She is amenable with plan.  Given, lower concern for anaphylaxis will defer EpiPen at this time.  No history or family history to suggest inherited angioedema.  Discharged stable condition.  Strict return precautions advised discussed.         ____________________________________________   FINAL CLINICAL IMPRESSION(S) / ED DIAGNOSES  Final diagnoses:  Angioedema, initial encounter    Medications  EPINEPHrine (EPI-PEN) injection 0.3 mg (0.3 mg Intramuscular Given 01/05/21 1925)  famotidine (PEPCID) IVPB 20 mg premix (0 mg Intravenous Stopped 01/05/21 2002)     ED Discharge Orders     None        Note:  This document was prepared using Dragon voice recognition software and may include unintentional dictation errors.    Lucrezia Starch, MD 01/05/21 2242

## 2021-01-05 NOTE — ED Notes (Signed)
Patient is being discharged from the Urgent Care and sent to the [facility] Emergency Department via EMS . Per Dr. Lacinda Axon, patient is in need of higher level of care due to allergic reaction. Patient is aware and verbalizes understanding of plan of care.  Vitals:   01/05/21 1733  BP: (!) 141/77  Pulse: 98  Resp: 16  Temp: 98 F (36.7 C)  SpO2: 100%

## 2021-01-05 NOTE — ED Provider Notes (Addendum)
[facility]    CSN: [id number] Arrival date & time: 01/05/21  1731      History   Chief Complaint Chief Complaint  Patient presents with   Tongue Swelling    HPI  60 year old female presents with the above complaint.  Patient states that she ate hotdogs 2 hours prior to arrival.  She states she developed sudden onset swelling of the tongue.  No difficulty breathing although she is concerned about the tongue swelling.  No medications or interventions tried.  Patient did not have her medication list with her.  Review of the electronic medical record did not show that she was on an ACE inhibitor.  I explicitly asked her about use of an ACE inhibitor and she is unsure.  Past Medical History:  Diagnosis Date   Diabetes mellitus without complication (HCC)    GERD (gastroesophageal reflux disease)    Gout    Hyperlipidemia     Patient Active Problem List   Diagnosis Date Noted   Special screening for malignant neoplasms, colon    Polyp of transverse colon     Past Surgical History:  Procedure Laterality Date   COLONOSCOPY WITH PROPOFOL N/A 02/12/2019   Procedure: COLONOSCOPY WITH PROPOFOL;  Surgeon: Lucilla Lame, MD;  Location: [place];  Service: Endoscopy;  Laterality: N/A;  Diabetic   PARTIAL HYSTERECTOMY     POLYPECTOMY  02/12/2019   Procedure: POLYPECTOMY;  Surgeon: Lucilla Lame, MD;  Location: [place];  Service: Endoscopy;;    OB History   No obstetric history on file.      Home Medications    Prior to Admission medications   Medication Sig Start Date End Date Taking? Authorizing Provider  allopurinol (ZYLOPRIM) 100 MG tablet Take 50 mg by mouth daily.    [provider]  aspirin EC 81 MG tablet Take 81 mg by mouth daily.    [provider]  atorvastatin (LIPITOR) 10 MG tablet Take 20 mg by mouth daily.    [provider]  Cholecalciferol (VITAMIN D) 50 MCG (2000 UT) CAPS Take by mouth.    [provider]  furosemide (LASIX) 20 MG tablet Take 20 mg by mouth.    [provider]  glipiZIDE (GLUCOTROL) 10 MG tablet Take 10 mg by mouth daily before breakfast.    [provider]  Insulin Degludec (TRESIBA [place]) Inject 30 Units into the skin daily.    [provider]  lansoprazole (PREVACID) 30 MG capsule Take 30 mg by mouth daily at 12 noon.    [provider]  lidocaine (XYLOCAINE) 2 % solution Use as directed 15 mLs in the mouth or throat every 3 (three) hours as needed for mouth pain (swish and spit). 10/25/20   Danton Clap, PA-C  Semaglutide (OZEMPIC, 1 MG/DOSE, ) Inject 5 Units into the skin once a week.    [provider]  sitaGLIPtin (JANUVIA) 100 MG tablet Take 100 mg by mouth daily.    [provider]    Family History Family History  Problem Relation Age of Onset   Breast cancer Neg Hx     Social History Social History   Tobacco Use   Smoking status: Former   Smokeless tobacco: Never  Scientific laboratory technician Use: Never used  Substance Use Topics   Alcohol use: Not Currently   Drug use: Never     Allergies   Patient has no known allergies.   Review of Systems Review  of Systems  HENT:         Tongue swelling.  Respiratory:  Negative for shortness of breath.   Cardiovascular: Negative.     Physical Exam Triage Vital Signs ED Triage Vitals [01/05/21 1733]  Enc Vitals Group     BP (!) 141/77     Pulse Rate 98     Resp 16     Temp 98 F (36.7 C)     Temp Source Temporal     SpO2 100 %     Weight 257 lb 0.9 oz (116.6 kg)     Height '5\' 3"'$  (1.6 m)     Head Circumference      Peak Flow      Pain Score 0     Pain Loc      Pain Edu?      Excl. in [place]?    Updated Vital Signs BP (!) 141/77 (BP Location: Left Arm)   Pulse 98   Temp 98 F (36.7 C) (Temporal)   Resp 16   Ht '5\' 3"'$  (1.6 m)   Wt 116.6 kg   LMP 10/20/2014 (Approximate)   SpO2 100%   BMI 45.54 kg/m   Visual Acuity Right Eye  Distance:   Left Eye Distance:   Bilateral Distance:    Right Eye Near:   Left Eye Near:    Bilateral Near:     Physical Exam Vitals and nursing note reviewed.  Constitutional:      Comments: Patient is in no acute distress.  There is obvious severe tongue swelling.  HENT:     Head: Normocephalic and atraumatic.     Mouth/Throat:     Comments: Severe swelling of the tongue.  No appreciable lip swelling. Eyes:     General:        Right eye: No discharge.        Left eye: No discharge.     Conjunctiva/sclera: Conjunctivae normal.  Cardiovascular:     Rate and Rhythm: Normal rate and regular rhythm.  Pulmonary:     Effort: Pulmonary effort is normal.     Breath sounds: No wheezing, rhonchi or rales.  Neurological:     Mental Status: She is alert.  Psychiatric:        Mood and Affect: Mood normal.        Behavior: Behavior normal.     UC Treatments / Results  Labs (all labs ordered are listed, but only abnormal results are displayed) Labs Reviewed - No data to display  EKG   Radiology No results found.  Procedures Procedures (including critical care time)  Medications Ordered in UC Medications  methylPREDNISolone sodium succinate (SOLU-MEDROL) 125 mg/2 mL injection 125 mg (125 mg Intravenous Given 01/05/21 1754)  diphenhydrAMINE (BENADRYL) injection 50 mg (50 mg Intravenous Given 01/05/21 1753)    Initial Impression / Assessment and Plan / UC Course  I have reviewed the triage vital signs and the nursing notes.  Pertinent labs & imaging results that were available during my care of the patient were reviewed by me and considered in my medical decision making (see chart for details).    60 year old female presents with angioedema.  She has severe swelling of the tongue.  Her airway is patent.  She is breathing without difficulty at this time.  IV was placed.  IV Solu-Medrol and IV Benadryl given.  Patient is being transported to the hospital for further evaluation  and monitoring.  She needs close monitoring to ensure no  airway compromise.  Final Clinical Impressions(s) / UC Diagnoses   Final diagnoses:  Angioedema, initial encounter   Discharge Instructions   None    ED Prescriptions   None    PDMP not reviewed this encounter.       Coral Spikes, [place] 01/05/21 1942

## 2021-01-23 ENCOUNTER — Other Ambulatory Visit: Payer: Self-pay | Admitting: Nurse Practitioner

## 2021-01-23 DIAGNOSIS — Z1231 Encounter for screening mammogram for malignant neoplasm of breast: Secondary | ICD-10-CM

## 2021-02-15 ENCOUNTER — Ambulatory Visit
Admission: RE | Admit: 2021-02-15 | Discharge: 2021-02-15 | Disposition: A | Discharge status: Home / Self Care | Payer: Medicare Other | Source: Ambulatory Visit | Attending: Nurse Practitioner | Admitting: Nurse Practitioner

## 2021-02-15 ENCOUNTER — Other Ambulatory Visit: Payer: Self-pay

## 2021-02-15 DIAGNOSIS — Z1231 Encounter for screening mammogram for malignant neoplasm of breast: Secondary | ICD-10-CM | POA: Insufficient documentation

## 2021-09-26 ENCOUNTER — Other Ambulatory Visit: Payer: Self-pay | Admitting: Nurse Practitioner

## 2021-09-26 DIAGNOSIS — R748 Abnormal levels of other serum enzymes: Secondary | ICD-10-CM

## 2021-09-26 DIAGNOSIS — I10 Essential (primary) hypertension: Secondary | ICD-10-CM

## 2021-09-26 DIAGNOSIS — N183 Chronic kidney disease, stage 3 unspecified: Secondary | ICD-10-CM

## 2021-09-26 DIAGNOSIS — Z794 Long term (current) use of insulin: Secondary | ICD-10-CM

## 2021-09-26 DIAGNOSIS — Z780 Asymptomatic menopausal state: Secondary | ICD-10-CM

## 2021-09-26 DIAGNOSIS — E782 Mixed hyperlipidemia: Secondary | ICD-10-CM

## 2021-09-26 DIAGNOSIS — R161 Splenomegaly, not elsewhere classified: Secondary | ICD-10-CM

## 2021-09-26 DIAGNOSIS — K219 Gastro-esophageal reflux disease without esophagitis: Secondary | ICD-10-CM

## 2021-09-26 DIAGNOSIS — N926 Irregular menstruation, unspecified: Secondary | ICD-10-CM

## 2021-09-26 DIAGNOSIS — E1121 Type 2 diabetes mellitus with diabetic nephropathy: Secondary | ICD-10-CM

## 2021-09-26 DIAGNOSIS — Z79899 Other long term (current) drug therapy: Secondary | ICD-10-CM

## 2021-09-26 DIAGNOSIS — D649 Anemia, unspecified: Secondary | ICD-10-CM

## 2021-09-26 DIAGNOSIS — E559 Vitamin D deficiency, unspecified: Secondary | ICD-10-CM

## 2021-09-26 DIAGNOSIS — Z713 Dietary counseling and surveillance: Secondary | ICD-10-CM

## 2021-09-26 DIAGNOSIS — M109 Gout, unspecified: Secondary | ICD-10-CM

## 2021-10-02 ENCOUNTER — Ambulatory Visit
Admission: RE | Admit: 2021-10-02 | Discharge: 2021-10-02 | Disposition: A | Discharge status: Home / Self Care | Payer: Medicare Other | Source: Ambulatory Visit | Attending: Nurse Practitioner | Admitting: Nurse Practitioner

## 2021-10-02 DIAGNOSIS — N926 Irregular menstruation, unspecified: Secondary | ICD-10-CM | POA: Insufficient documentation

## 2021-10-04 ENCOUNTER — Ambulatory Visit
Admission: RE | Admit: 2021-10-04 | Discharge: 2021-10-04 | Disposition: A | Discharge status: Home / Self Care | Payer: Medicare Other | Source: Ambulatory Visit | Attending: Nurse Practitioner | Admitting: Nurse Practitioner

## 2021-10-04 DIAGNOSIS — R161 Splenomegaly, not elsewhere classified: Secondary | ICD-10-CM | POA: Insufficient documentation

## 2021-10-04 DIAGNOSIS — Z713 Dietary counseling and surveillance: Secondary | ICD-10-CM | POA: Diagnosis present

## 2021-10-04 DIAGNOSIS — E1121 Type 2 diabetes mellitus with diabetic nephropathy: Secondary | ICD-10-CM | POA: Diagnosis present

## 2021-10-04 DIAGNOSIS — Z794 Long term (current) use of insulin: Secondary | ICD-10-CM | POA: Diagnosis present

## 2021-10-04 DIAGNOSIS — E559 Vitamin D deficiency, unspecified: Secondary | ICD-10-CM | POA: Insufficient documentation

## 2021-10-04 DIAGNOSIS — R748 Abnormal levels of other serum enzymes: Secondary | ICD-10-CM | POA: Insufficient documentation

## 2021-10-04 DIAGNOSIS — M109 Gout, unspecified: Secondary | ICD-10-CM | POA: Insufficient documentation

## 2021-10-04 DIAGNOSIS — Z780 Asymptomatic menopausal state: Secondary | ICD-10-CM | POA: Insufficient documentation

## 2021-10-04 DIAGNOSIS — Z79899 Other long term (current) drug therapy: Secondary | ICD-10-CM | POA: Diagnosis present

## 2021-10-04 DIAGNOSIS — N183 Chronic kidney disease, stage 3 unspecified: Secondary | ICD-10-CM | POA: Insufficient documentation

## 2021-10-04 DIAGNOSIS — E782 Mixed hyperlipidemia: Secondary | ICD-10-CM | POA: Insufficient documentation

## 2021-10-04 DIAGNOSIS — K219 Gastro-esophageal reflux disease without esophagitis: Secondary | ICD-10-CM | POA: Insufficient documentation

## 2021-10-04 DIAGNOSIS — N926 Irregular menstruation, unspecified: Secondary | ICD-10-CM | POA: Insufficient documentation

## 2021-10-04 DIAGNOSIS — D649 Anemia, unspecified: Secondary | ICD-10-CM | POA: Insufficient documentation

## 2021-10-04 DIAGNOSIS — I10 Essential (primary) hypertension: Secondary | ICD-10-CM | POA: Insufficient documentation

## 2021-12-24 DIAGNOSIS — N950 Postmenopausal bleeding: Secondary | ICD-10-CM | POA: Insufficient documentation

## 2022-04-16 ENCOUNTER — Other Ambulatory Visit: Payer: Self-pay | Admitting: Nurse Practitioner

## 2022-04-16 DIAGNOSIS — Z1231 Encounter for screening mammogram for malignant neoplasm of breast: Secondary | ICD-10-CM

## 2022-05-09 ENCOUNTER — Ambulatory Visit (INDEPENDENT_AMBULATORY_CARE_PROVIDER_SITE_OTHER): Payer: Medicare Other

## 2022-05-09 ENCOUNTER — Ambulatory Visit
Admission: EM | Admit: 2022-05-09 | Discharge: 2022-05-09 | Disposition: A | Discharge status: Home / Self Care | Payer: Medicare Other | Attending: Physician Assistant | Admitting: Physician Assistant

## 2022-05-09 ENCOUNTER — Encounter: Payer: Self-pay | Admitting: Emergency Medicine

## 2022-05-09 ENCOUNTER — Ambulatory Visit
Admission: RE | Admit: 2022-05-09 | Discharge: 2022-05-09 | Disposition: A | Discharge status: Home / Self Care | Payer: Medicare Other | Source: Ambulatory Visit | Attending: Nurse Practitioner | Admitting: Nurse Practitioner

## 2022-05-09 DIAGNOSIS — M25511 Pain in right shoulder: Secondary | ICD-10-CM

## 2022-05-09 DIAGNOSIS — Z1231 Encounter for screening mammogram for malignant neoplasm of breast: Secondary | ICD-10-CM | POA: Insufficient documentation

## 2022-05-09 NOTE — ED Provider Notes (Signed)
[facility]    CSN: [id number] Arrival date & time: 05/09/22  9163      History   Chief Complaint Chief Complaint  Patient presents with   Shoulder Pain    right    HPI Jacqueline Chavez is a 61 y.o. female presenting for right shoulder pain for the past few days.  She denies any injury.  Reports that she wore a bra that was much tighter and the straps over the weekend and wonders if that could have caused the pain in her shoulder.  She says pain is worse when she moves certain ways.  She does have full range of motion of the shoulder.  Able to reach overhead seemingly without pain and behind her back.  Denies any radiation of pain down arm and no neck pain, numbness/tingling or weakness.  Think she has some arthritis in her shoulder but is not sure and would like an x-ray.  Has not taken anything for pain relief because she likes to avoid taking medication so she does not injure her kidneys.  No other complaints.  HPI  Past Medical History:  Diagnosis Date   Diabetes mellitus without complication (HCC)    GERD (gastroesophageal reflux disease)    Gout    Hyperlipidemia     Patient Active Problem List   Diagnosis Date Noted   Special screening for malignant neoplasms, colon    Polyp of transverse colon     Past Surgical History:  Procedure Laterality Date   COLONOSCOPY WITH PROPOFOL N/A 02/12/2019   Procedure: COLONOSCOPY WITH PROPOFOL;  Surgeon: Lucilla Lame, MD;  Location: [place];  Service: Endoscopy;  Laterality: N/A;  Diabetic   PARTIAL HYSTERECTOMY     POLYPECTOMY  02/12/2019   Procedure: POLYPECTOMY;  Surgeon: Lucilla Lame, MD;  Location: [place];  Service: Endoscopy;;    OB History   No obstetric history on file.      Home Medications    Prior to Admission medications   Medication Sig Start Date End Date Taking? Authorizing Provider  allopurinol (ZYLOPRIM) 100 MG tablet Take 50 mg by mouth daily.   Yes [provider]  aspirin EC 81 MG tablet Take 81 mg by mouth daily.   Yes [provider]  atorvastatin (LIPITOR) 10 MG tablet Take 20 mg by mouth daily.   Yes [provider]  Cholecalciferol (VITAMIN D) 50 MCG (2000 UT) CAPS Take by mouth.   Yes [provider]  furosemide (LASIX) 20 MG tablet Take 20 mg by mouth.   Yes [provider]  glipiZIDE (GLUCOTROL) 10 MG tablet Take 10 mg by mouth daily before breakfast.   Yes [provider]  Insulin Degludec (TRESIBA [place]) Inject 30 Units into the skin daily.   Yes [provider]  lansoprazole (PREVACID) 30 MG capsule Take 30 mg by mouth daily at 12 noon.   Yes [provider]  Semaglutide (OZEMPIC, 1 MG/DOSE, [place]) Inject 5 Units into the skin once a week.   Yes [provider]  sitaGLIPtin (JANUVIA) 100 MG tablet Take 100 mg by mouth daily.   Yes [provider]  lidocaine (XYLOCAINE) 2 % solution Use as directed 15 mLs in the mouth or throat every 3 (three) hours as needed for mouth pain (swish and spit). 10/25/20   Danton Clap, PA-C    Family History Family History  Problem Relation Age of Onset   Breast cancer Neg Hx  Social History Social History   Tobacco Use   Smoking status: Former   Smokeless tobacco: Never  Scientific laboratory technician Use: Never used  Substance Use Topics   Alcohol use: Not Currently   Drug use: Never     Allergies   Patient has no known allergies.   Review of Systems Review of Systems  Musculoskeletal:  Positive for arthralgias. Negative for joint swelling, neck pain and neck stiffness.  Neurological:  Negative for weakness and numbness.     Physical Exam Triage Vital Signs ED Triage Vitals  Enc Vitals Group     BP      Pulse      Resp      Temp      Temp src      SpO2      Weight      Height      Head Circumference      Peak Flow      Pain Score      Pain Loc      Pain Edu?      Excl. in [place]?    No data  found.  Updated Vital Signs BP (!) 142/62 (BP Location: Left Arm)   Pulse 86   Temp 98.1 F (36.7 C) (Oral)   Resp 16   Ht '5\' 3"'$  (1.6 m)   Wt 255 lb 11.7 oz (116 kg)   LMP 10/20/2014 (Approximate)   SpO2 96%   BMI 45.30 kg/m     Physical Exam Vitals and nursing note reviewed.  Constitutional:      General: She is not in acute distress.    Appearance: Normal appearance. She is not ill-appearing or toxic-appearing.  HENT:     Head: Normocephalic and atraumatic.  Eyes:     General: No scleral icterus.       Right eye: No discharge.        Left eye: No discharge.     Conjunctiva/sclera: Conjunctivae normal.  Cardiovascular:     Rate and Rhythm: Normal rate and regular rhythm.     Heart sounds: Normal heart sounds.  Pulmonary:     Effort: Pulmonary effort is normal. No respiratory distress.     Breath sounds: Normal breath sounds.  Musculoskeletal:     Right shoulder: Tenderness (mild TTP over AC joint) present. No swelling or deformity. Normal range of motion. Normal strength. Normal pulse.     Cervical back: Neck supple.     Comments: No tenderness of neck.  Full range of motion of neck and shoulder.  Able to reach over and touch opposite shoulder without any pain.  Skin:    General: Skin is dry.  Neurological:     General: No focal deficit present.     Mental Status: She is alert. Mental status is at baseline.     Motor: No weakness.     Gait: Gait normal.  Psychiatric:        Mood and Affect: Mood normal.        Behavior: Behavior normal.        Thought Content: Thought content normal.      UC Treatments / Results  Labs (all labs ordered are listed, but only abnormal results are displayed) Labs Reviewed - No data to display  EKG   Radiology DG Shoulder Right  Result Date: 05/09/2022 CLINICAL DATA:  RIGHT shoulder pain for 3 days, no known injury EXAM: RIGHT SHOULDER - 2+ VIEW COMPARISON:  None available FINDINGS:  Osseous mineralization low normal. AC  joint alignment normal. No acute fracture, dislocation or bone destruction. Visualized ribs unremarkable. IMPRESSION: No osseous abnormalities. Electronically Signed   By: Lavonia Dana M.D.   On: 05/09/2022 10:01    Procedures Procedures (including critical care time)  Medications Ordered in UC Medications - No data to display  Initial Impression / Assessment and Plan / UC Course  I have reviewed the triage vital signs and the nursing notes.  Pertinent labs & imaging results that were available during my care of the patient were reviewed by me and considered in my medical decision making (see chart for details).   61 year old female presents for right shoulder pain for the past few days.  No injury.  Reports she wears tight bra before onset of symptoms.  Pain does not radiate and no numbness, tingling or weakness and no associated neck pain.  X-ray obtained today shows no acute abnormality.  Discussed result with patient.  Likely minimal arthritis flareup or could be related to the bra that she was wearing.  Tenderness is over the [facility] joint which is where the strep might have gone.  There is no bruising or swelling.  She has full range of motion, good strength and sensation.  Advised Tylenol, Voltaren gel, ice, heat, stretches.  Advised to follow-up with PCP or Ortho if not improving.   Final Clinical Impressions(s) / UC Diagnoses   Final diagnoses:  Acute pain of right shoulder     Discharge Instructions      -X-ray looks good.  Take 650 milligrams Tylenol 3 times a day as needed for pain relief.  May also use Voltaren gel, IcyHot, heat, ice and make sure to stretch her shoulder.  If no improvement in the next week follow-up with PCP.       ED Prescriptions   None    PDMP not reviewed this encounter.   Danton Clap, PA-C 05/09/22 1029

## 2022-05-09 NOTE — ED Triage Notes (Signed)
Pt c/o right shoulder pain. Started about 3 days ago. No known injury.

## 2022-05-09 NOTE — Discharge Instructions (Addendum)
-  X-ray looks good.  Take 650 milligrams Tylenol 3 times a day as needed for pain relief.  May also use Voltaren gel, IcyHot, heat, ice and make sure to stretch her shoulder.  If no improvement in the next week follow-up with PCP.

## 2022-11-05 ENCOUNTER — Other Ambulatory Visit: Payer: Self-pay | Admitting: Nurse Practitioner

## 2022-11-05 DIAGNOSIS — Z1231 Encounter for screening mammogram for malignant neoplasm of breast: Secondary | ICD-10-CM

## 2023-05-12 ENCOUNTER — Ambulatory Visit
Admission: RE | Admit: 2023-05-12 | Discharge: 2023-05-12 | Disposition: A | Discharge status: Home / Self Care | Payer: 59 | Source: Ambulatory Visit | Attending: Nurse Practitioner | Admitting: Nurse Practitioner

## 2023-05-12 DIAGNOSIS — Z1231 Encounter for screening mammogram for malignant neoplasm of breast: Secondary | ICD-10-CM | POA: Insufficient documentation

## 2023-09-05 ENCOUNTER — Telehealth: Payer: Self-pay

## 2023-09-05 NOTE — Telephone Encounter (Signed)
 Returned phone call to patient.  LVM informing her that her colonoscopy will be due after 02/12/24 with Dr. Servando Snare and I will call her in August to schedule for September.  Thanks, [place], [place]

## 2023-09-05 NOTE — Telephone Encounter (Signed)
 [phone] Pt received letter stating she needed to make appt for colonoscopy. I did not see a letter sent under letters. I wasn't sure who to give it to.

## 2023-09-09 ENCOUNTER — Other Ambulatory Visit: Payer: Self-pay | Admitting: Nurse Practitioner

## 2023-09-09 DIAGNOSIS — Z1231 Encounter for screening mammogram for malignant neoplasm of breast: Secondary | ICD-10-CM

## 2023-12-29 ENCOUNTER — Telehealth: Payer: Self-pay

## 2023-12-29 ENCOUNTER — Other Ambulatory Visit: Payer: Self-pay

## 2023-12-29 DIAGNOSIS — Z860100 Personal history of colon polyps, unspecified: Secondary | ICD-10-CM

## 2023-12-29 MED ORDER — NA SULFATE-K SULFATE-MG SULF 17.5-3.13-1.6 GM/177ML PO SOLN: 1.0000 | Freq: Once | ORAL | 0 refills | Status: AC

## 2023-12-29 NOTE — Addendum Note (Signed)
 Addended by: CURTISS ROSALINE RAMAN on: 12/29/2023 11:20 AM   Modules accepted: Orders

## 2023-12-29 NOTE — Telephone Encounter (Signed)
 LVM asking patient to call office if she would like to go ahead and schedule her colonoscopy as Dr. Felicitas procedures are now into November.  Thanks,  [place], CMA

## 2023-12-29 NOTE — Telephone Encounter (Signed)
 Gastroenterology Pre-Procedure Review  Request Date: 03/30/24 Requesting Physician: Dr. Jinny  PATIENT REVIEW QUESTIONS: The patient responded to the following health history questions as indicated:    1. Are you having any GI issues? no 2. Do you have a personal history of Polyps? yes (last colonoscopy performed by Dr. Jinny 02/12/2019) 3. Do you have a family history of Colon Cancer or Polyps? no 4. Diabetes Mellitus? yes (takes Glipizide, Ozempic, Januvia, Jardiance,Tresiba stop dates verbally advised and noted on instructions) 5. Joint replacements in the past 12 months?no 6. Major health problems in the past 3 months?no 7. Any artificial heart valves, MVP, or defibrillator?no    MEDICATIONS & ALLERGIES:    Patient reports the following regarding taking any anticoagulation/antiplatelet therapy:   Plavix, Coumadin, Eliquis, Xarelto, Lovenox, Pradaxa, Brilinta, or Effient? no Aspirin? no  Patient confirms/reports the following medications:  Current Outpatient Medications  Medication Sig Dispense Refill   allopurinol (ZYLOPRIM) 100 MG tablet Take 50 mg by mouth daily.     aspirin EC 81 MG tablet Take 81 mg by mouth daily.     atorvastatin (LIPITOR) 10 MG tablet Take 20 mg by mouth daily.     Cholecalciferol (VITAMIN D) 50 MCG (2000 UT) CAPS Take by mouth.     furosemide (LASIX) 20 MG tablet Take 20 mg by mouth.     glipiZIDE (GLUCOTROL) 10 MG tablet Take 10 mg by mouth daily before breakfast.     Insulin Degludec (TRESIBA [place]) Inject 30 Units into the skin daily.     lansoprazole (PREVACID) 30 MG capsule Take 30 mg by mouth daily at 12 noon.     lidocaine  (XYLOCAINE ) 2 % solution Use as directed 15 mLs in the mouth or throat every 3 (three) hours as needed for mouth pain (swish and spit). 100 mL 0   Semaglutide (OZEMPIC, 1 MG/DOSE, [place]) Inject 5 Units into the skin once a week.     sitaGLIPtin (JANUVIA) 100 MG tablet Take 100 mg by mouth daily.     No current facility-administered  medications for this visit.    Patient confirms/reports the following allergies:  No Known Allergies  No orders of the defined types were placed in this encounter.   AUTHORIZATION INFORMATION Primary Insurance: 1D#: Group #:  Secondary Insurance: 1D#: Group #:  SCHEDULE INFORMATION: Date: 03/30/24 Time: Location: [facility]

## 2024-02-16 ENCOUNTER — Ambulatory Visit: Payer: Self-pay | Admitting: Emergency Medicine

## 2024-02-16 ENCOUNTER — Ambulatory Visit
Admission: EM | Admit: 2024-02-16 | Discharge: 2024-02-16 | Disposition: A | Discharge status: Home / Self Care | Attending: Emergency Medicine | Admitting: Emergency Medicine

## 2024-02-16 DIAGNOSIS — N189 Chronic kidney disease, unspecified: Secondary | ICD-10-CM | POA: Diagnosis present

## 2024-02-16 DIAGNOSIS — R7989 Other specified abnormal findings of blood chemistry: Secondary | ICD-10-CM | POA: Diagnosis present

## 2024-02-16 DIAGNOSIS — Z8719 Personal history of other diseases of the digestive system: Secondary | ICD-10-CM | POA: Insufficient documentation

## 2024-02-16 DIAGNOSIS — R1011 Right upper quadrant pain: Secondary | ICD-10-CM | POA: Diagnosis present

## 2024-02-16 LAB — COMPREHENSIVE METABOLIC PANEL WITH GFR
ALT: 23 U/L (ref 0–44)
AST: 41 U/L (ref 15–41)
Albumin: 4 g/dL (ref 3.5–5.0)
Alkaline Phosphatase: 159 U/L — ABNORMAL HIGH (ref 38–126)
Anion gap: 11 (ref 5–15)
BUN: 25 mg/dL — ABNORMAL HIGH (ref 8–23)
CO2: 24 mmol/L (ref 22–32)
Calcium: 10 mg/dL (ref 8.9–10.3)
Chloride: 100 mmol/L (ref 98–111)
Creatinine, Ser: 2.23 mg/dL — ABNORMAL HIGH (ref 0.44–1.00)
GFR, Estimated: 24 mL/min — ABNORMAL LOW (ref >60)
Glucose, Bld: 251 mg/dL — ABNORMAL HIGH (ref 70–99)
Potassium: 4.6 mmol/L (ref 3.5–5.1)
Sodium: 135 mmol/L (ref 135–145)
Total Bilirubin: 1.3 mg/dL — ABNORMAL HIGH (ref 0.0–1.2)
Total Protein: 7.8 g/dL (ref 6.5–8.1)

## 2024-02-16 MED ORDER — FAMOTIDINE 10 MG PO TABS: 20.0000 mg | ORAL_TABLET | ORAL | 0 refills | Status: AC

## 2024-02-16 MED ORDER — FAMOTIDINE 20 MG PO TABS: 20.0000 mg | ORAL_TABLET | Freq: Two times a day (BID) | ORAL | 0 refills | Status: DC

## 2024-02-16 MED ORDER — PANTOPRAZOLE SODIUM 20 MG PO TBEC: 20.0000 mg | DELAYED_RELEASE_TABLET | Freq: Every day | ORAL | 0 refills | Status: AC

## 2024-02-16 NOTE — ED Triage Notes (Signed)
 Pt c/o abd pain,bloating & burping x2 days. States pain worse after eating.Hx of GERD. Has tried lansoprazole w/o relief.

## 2024-02-16 NOTE — ED Provider Notes (Signed)
 HPI  SUBJECTIVE:  Jacqueline Chavez is a 63 y.o. female who presents with seconds long, intermittent, achy nonradiating upper abdominal pain after eating starting 2 to 3 days ago.  It is not present at any other time.  It does not radiate into her chest or through to her back.  No accompanying nausea, vomiting, fevers, diaphoresis.  No burning chest pain, belching, waterbrash, abdominal distention.  No chest pain.  No urinary complaints.  She states this is identical to previous episodes of GERD.  She has been taking all the lansoprazole that is prescribed to her which is no longer working.  She has not tried any other PPIs.  No alleviating factors.  Symptoms are worse with eating and with drinking cold fluids.  There is no exertional component to it. She has a past medical history of diabetes, hyperlipidemia, chronic kidney disease stage III and GERD.  No history of atrial fibrillation, mesenteric ischemia, hypercoagulopathy, H. pylori infection, peptic ulcer disease, pancreatitis, liver disease.  States that she is status post cholecystectomy.  PCP: [facility]  Past Medical History:  Diagnosis Date   Diabetes mellitus without complication (HCC)    GERD (gastroesophageal reflux disease)    Gout    Hyperlipidemia     Past Surgical History:  Procedure Laterality Date   COLONOSCOPY WITH PROPOFOL  N/A 02/12/2019   Procedure: COLONOSCOPY WITH PROPOFOL ;  Surgeon: Jinny Carmine, MD;  Location: [facility];  Service: Endoscopy;  Laterality: N/A;  Diabetic   PARTIAL HYSTERECTOMY     POLYPECTOMY  02/12/2019   Procedure: POLYPECTOMY;  Surgeon: Jinny Carmine, MD;  Location: [facility];  Service: Endoscopy;;    Family History  Problem Relation Age of Onset   Breast cancer Neg Hx     Social History   Tobacco Use   Smoking status: Former   Smokeless tobacco: Never  Vaping Use   Vaping status: Never Used  Substance Use Topics   Alcohol use: Not Currently   Drug use:  Never    No current facility-administered medications for this encounter.  Current Outpatient Medications:    pantoprazole  (PROTONIX ) 20 MG tablet, Take 1 tablet (20 mg total) by mouth daily., Disp: 30 tablet, Rfl: 0   allopurinol (ZYLOPRIM) 100 MG tablet, Take 50 mg by mouth daily., Disp: , Rfl:    aspirin EC 81 MG tablet, Take 81 mg by mouth daily., Disp: , Rfl:    atorvastatin (LIPITOR) 10 MG tablet, Take 20 mg by mouth daily., Disp: , Rfl:    Cholecalciferol (VITAMIN D) 50 MCG (2000 UT) CAPS, Take by mouth., Disp: , Rfl:    famotidine  (PEPCID ) 10 MG tablet, Take 2 tablets (20 mg total) by mouth every other day., Disp: 30 tablet, Rfl: 0   furosemide (LASIX) 20 MG tablet, Take 20 mg by mouth., Disp: , Rfl:    glipiZIDE (GLUCOTROL) 10 MG tablet, Take 10 mg by mouth daily before breakfast., Disp: , Rfl:    Insulin Degludec (TRESIBA [place]), Inject 30 Units into the skin daily., Disp: , Rfl:    JARDIANCE 10 MG TABS tablet, TAKE ONE TABLET BY MOUTH EVERY DAY; Duration: 30 days, Disp: , Rfl:    Semaglutide (OZEMPIC, 1 MG/DOSE, [place]), Inject 5 Units into the skin once a week., Disp: , Rfl:    sitaGLIPtin (JANUVIA) 100 MG tablet, Take 100 mg by mouth daily., Disp: , Rfl:    Vitamin D, Ergocalciferol, (DRISDOL) 1.25 MG (50000 UNIT) CAPS capsule, Take 50,000 Units by mouth every  30 (thirty) days., Disp: , Rfl:   Allergies  Allergen Reactions   Ace Inhibitors Anaphylaxis     ROS  As noted in HPI.   Physical Exam  BP 133/79 (BP Location: Left Wrist)   Pulse 85   Temp 97.9 F (36.6 C) (Oral)   Resp 16   Ht 5' 3 (1.6 m)   Wt 122.5 kg   LMP 10/20/2014 (Approximate)   SpO2 99%   BMI 47.83 kg/m   Constitutional: Well developed, well nourished, no acute distress Eyes: PERRL, EOMI, conjunctiva normal bilaterally HENT: Normocephalic, atraumatic,mucus membranes moist Respiratory: Clear to auscultation bilaterally, no rales, no wheezing, no rhonchi Cardiovascular: Normal rate and rhythm, no  murmurs, no gallops, no rubs GI: Soft, nondistended, normal bowel sounds, mild tenderness in the right flank/right upper quadrant region, no rebound, no guarding Back: no CVAT skin: No rash, skin intact Musculoskeletal: No edema, no tenderness, no deformities Neurologic: Alert & oriented x 3, CN III-XII grossly intact, no motor deficits, sensation grossly intact Psychiatric: Speech and behavior appropriate   ED Course   Medications - No data to display  Orders Placed This Encounter  Procedures   Comprehensive metabolic panel    Standing Status:   Standing    Number of Occurrences:   1   ED EKG    Indigestion    Standing Status:   Standing    Number of Occurrences:   1    Reason for Exam:   Other (see Comments)   EKG 12-Lead    Standing Status:   Standing    Number of Occurrences:   1   Results for orders placed or performed during the hospital encounter of 02/16/24 (from the past 24 hours)  Comprehensive metabolic panel     Status: Abnormal   Collection Time: 02/16/24  2:14 PM  Result Value Ref Range   Sodium 135 135 - 145 mmol/L   Potassium 4.6 3.5 - 5.1 mmol/L   Chloride 100 98 - 111 mmol/L   CO2 24 22 - 32 mmol/L   Glucose, Bld 251 (H) 70 - 99 mg/dL   BUN 25 (H) 8 - 23 mg/dL   Creatinine, Ser 7.76 (H) 0.44 - 1.00 mg/dL   Calcium 89.9 8.9 - 89.6 mg/dL   Total Protein 7.8 6.5 - 8.1 g/dL   Albumin 4.0 3.5 - 5.0 g/dL   AST 41 15 - 41 U/L   ALT 23 0 - 44 U/L   Alkaline Phosphatase 159 (H) 38 - 126 U/L   Total Bilirubin 1.3 (H) 0.0 - 1.2 mg/dL   GFR, Estimated 24 (L) >60 mL/min   Anion gap 11 5 - 15   No results found.  ED Clinical Impression  1. Right upper quadrant abdominal pain   2. History of gastroesophageal reflux (GERD)   3. Chronic kidney disease, unspecified CKD stage   4. Elevated serum creatinine      ED Assessment/Plan    Outside record reviewed.  Additional medical history obtained.  As noted in HPI. EKG: Normal sinus rhythm, rate 83.  Normal  axis, normal intervals.  No hypertrophy.  No ST-T wave changes.  Isolated Q wave in 3 which is new, otherwise no changes since EKG done in 2022.  Patient asymptomatic while EKG was obtained.  Patient presents with seconds long, sharp upper abdominal pain primarily on the right after eating that she states is identical to previous episodes of gastritis/GERD.  Low suspicion for ACS, mesenteric ischemia, perforated ulcer.  States  that she is status post cholecystectomy.  She has some mild tenderness along the right flank/right upper quadrant.  She denies urinary symptoms.  Will check CMP.  CMP reviewed.  Hyperglycemic, patient is a known diabetic.  Elevated alk phos, borderline elevated total bilirubin.  Creatinine 2.23.  No previous creatinine to compare this with.  Will have staff reach out to patient's PCP to find out what her baseline creatinine is and let them know what today's is.  Previous creatinine 2.17 on November 25, 2023 per PCP.  Will have her PCP decide whether or not she needs ER evaluation or whether she can follow-up as outpatient.   Calculated creatinine clearance based on labs done from today 23 mL/min  Will discontinue the lansoprazole and try starting her on pantoprazole  20 mg daily and Pepcid  daily.  Do not need to renally dose pantoprazole .  Will have her take 10 mg of Pepcid  every other day per up-to-date guidelines.  Staff will contact patient to let her know of dose change.  She is to follow-up with her primary care provider or gastroenterologist.  ER return precautions given.  Discussed labs, MDM, treatment plan, and plan for follow-up with patient Discussed sn/sx that should prompt return to the ED. patient agrees with plan.   Meds ordered this encounter  Medications   DISCONTD: famotidine  (PEPCID ) 20 MG tablet    Sig: Take 1 tablet (20 mg total) by mouth 2 (two) times daily.    Dispense:  40 tablet    Refill:  0   pantoprazole  (PROTONIX ) 20 MG tablet    Sig: Take 1 tablet (20  mg total) by mouth daily.    Dispense:  30 tablet    Refill:  0   famotidine  (PEPCID ) 10 MG tablet    Sig: Take 2 tablets (20 mg total) by mouth every other day.    Dispense:  30 tablet    Refill:  0      *This clinic note was created using Scientist, clinical (histocompatibility and immunogenetics). Therefore, there may be occasional mistakes despite careful proofreading. ?    Van Knee, MD 02/16/24 1501

## 2024-02-16 NOTE — Discharge Instructions (Signed)
 We will contact you if and only if your labs come back abnormal.  Your EKG was unremarkable.  Try the pantoprazole  and Pepcid  as written.

## 2024-02-16 NOTE — Progress Notes (Signed)
 Previous creatinine 2.13 from November 25, 2023 per PCP.  Will let PCP decide whether or not patient needs to go to the emergency department for further evaluation.  Will also renally dose her Pepcid  to 10 mg every other day.  Staff to contact patient to let her know of dose change.

## 2024-03-30 ENCOUNTER — Ambulatory Visit

## 2024-03-30 ENCOUNTER — Other Ambulatory Visit: Payer: Self-pay

## 2024-03-30 ENCOUNTER — Telehealth: Payer: Self-pay

## 2024-03-30 ENCOUNTER — Encounter
Admission: RE | Disposition: A | Discharge status: Home / Self Care | Payer: Self-pay | Source: Home / Self Care | Attending: Gastroenterology

## 2024-03-30 ENCOUNTER — Ambulatory Visit
Admission: RE | Admit: 2024-03-30 | Discharge: 2024-03-30 | Disposition: A | Discharge status: Home / Self Care | Attending: Gastroenterology | Admitting: Gastroenterology

## 2024-03-30 ENCOUNTER — Encounter: Payer: Self-pay | Admitting: Gastroenterology

## 2024-03-30 DIAGNOSIS — Z794 Long term (current) use of insulin: Secondary | ICD-10-CM | POA: Diagnosis not present

## 2024-03-30 DIAGNOSIS — Z860100 Personal history of colon polyps, unspecified: Secondary | ICD-10-CM

## 2024-03-30 DIAGNOSIS — Z7984 Long term (current) use of oral hypoglycemic drugs: Secondary | ICD-10-CM | POA: Insufficient documentation

## 2024-03-30 DIAGNOSIS — Z7982 Long term (current) use of aspirin: Secondary | ICD-10-CM | POA: Diagnosis not present

## 2024-03-30 DIAGNOSIS — Z87891 Personal history of nicotine dependence: Secondary | ICD-10-CM | POA: Insufficient documentation

## 2024-03-30 DIAGNOSIS — Z7985 Long-term (current) use of injectable non-insulin antidiabetic drugs: Secondary | ICD-10-CM | POA: Diagnosis not present

## 2024-03-30 DIAGNOSIS — Z79899 Other long term (current) drug therapy: Secondary | ICD-10-CM | POA: Diagnosis not present

## 2024-03-30 DIAGNOSIS — Z1211 Encounter for screening for malignant neoplasm of colon: Secondary | ICD-10-CM | POA: Insufficient documentation

## 2024-03-30 DIAGNOSIS — E785 Hyperlipidemia, unspecified: Secondary | ICD-10-CM | POA: Diagnosis not present

## 2024-03-30 DIAGNOSIS — M109 Gout, unspecified: Secondary | ICD-10-CM | POA: Diagnosis not present

## 2024-03-30 DIAGNOSIS — Z860101 Personal history of adenomatous and serrated colon polyps: Secondary | ICD-10-CM | POA: Insufficient documentation

## 2024-03-30 DIAGNOSIS — E119 Type 2 diabetes mellitus without complications: Secondary | ICD-10-CM | POA: Diagnosis not present

## 2024-03-30 HISTORY — PX: COLONOSCOPY: SHX5424

## 2024-03-30 LAB — GLUCOSE, CAPILLARY: Glucose-Capillary: 158 mg/dL — ABNORMAL HIGH (ref 70–99)

## 2024-03-30 SURGERY — COLONOSCOPY
Anesthesia: General

## 2024-03-30 MED ORDER — SODIUM CHLORIDE 0.9 % IV SOLN: INTRAVENOUS | Status: DC

## 2024-03-30 MED ORDER — LIDOCAINE HCL (PF) 2 % IJ SOLN
INTRAMUSCULAR | Status: AC
  Filled 2024-03-30: qty 5

## 2024-03-30 MED ORDER — PROPOFOL 10 MG/ML IV BOLUS
INTRAVENOUS | Status: DC | PRN
  Administered 2024-03-30: 30 mg via INTRAVENOUS
  Administered 2024-03-30: 70 mg via INTRAVENOUS

## 2024-03-30 MED ORDER — PROPOFOL 1000 MG/100ML IV EMUL
INTRAVENOUS | Status: AC
Start: 2024-03-30 — End: 2024-03-30
  Filled 2024-03-30: qty 100

## 2024-03-30 NOTE — H&P (Signed)
 Jacqueline Copping, MD [facility] [address] [place], [place] [address] [phone] Fax : [phone]  Primary Care Physician:  Johnson Morna FALCON, NP Primary Gastroenterologist:  Dr. Copping  Pre-Procedure History & Physical: HPI:  Jacqueline Chavez is a 63 y.o. female is here for an colonoscopy.   Past Medical History:  Diagnosis Date   Diabetes mellitus without complication (HCC)    GERD (gastroesophageal reflux disease)    Gout    Hyperlipidemia     Past Surgical History:  Procedure Laterality Date   COLONOSCOPY WITH PROPOFOL  N/A 02/12/2019   Procedure: COLONOSCOPY WITH PROPOFOL ;  Surgeon: Chavez Rogelia, MD;  Location: [facility];  Service: Endoscopy;  Laterality: N/A;  Diabetic   PARTIAL HYSTERECTOMY     POLYPECTOMY  02/12/2019   Procedure: POLYPECTOMY;  Surgeon: Chavez Rogelia, MD;  Location: [facility];  Service: Endoscopy;;    Prior to Admission medications   Medication Sig Start Date End Date Taking? Authorizing Provider  allopurinol (ZYLOPRIM) 100 MG tablet Take 50 mg by mouth daily.    [provider]  aspirin EC 81 MG tablet Take 81 mg by mouth daily.    [provider]  atorvastatin (LIPITOR) 10 MG tablet Take 20 mg by mouth daily.    [provider]  Cholecalciferol (VITAMIN D) 50 MCG (2000 UT) CAPS Take by mouth.    [provider]  famotidine  (PEPCID ) 10 MG tablet Take 2 tablets (20 mg total) by mouth every other day. 02/16/24   Mortenson, Ashley, MD  furosemide (LASIX) 20 MG tablet Take 20 mg by mouth.    [provider]  glipiZIDE (GLUCOTROL) 10 MG tablet Take 10 mg by mouth daily before breakfast.    [provider]  Insulin Degludec (TRESIBA [place]) Inject 30 Units into the skin daily.    [provider]  JARDIANCE 10 MG TABS tablet TAKE ONE TABLET BY MOUTH EVERY DAY; Duration: 30 days    [provider]  pantoprazole  (PROTONIX ) 20 MG tablet Take 1 tablet (20 mg total) by  mouth daily. 02/16/24   Van Knee, MD  Semaglutide (OZEMPIC, 1 MG/DOSE, [place]) Inject 5 Units into the skin once a week.    [provider]  sitaGLIPtin (JANUVIA) 100 MG tablet Take 100 mg by mouth daily.    [provider]  Vitamin D, Ergocalciferol, (DRISDOL) 1.25 MG (50000 UNIT) CAPS capsule Take 50,000 Units by mouth every 30 (thirty) days.    [provider]    Allergies as of 12/29/2023 - Review Complete 05/09/2022  Allergen Reaction Noted   Ace inhibitors Anaphylaxis 12/29/2023    Family History  Problem Relation Age of Onset   Breast cancer Neg Hx     Social History   Socioeconomic History   Marital status: Single    Spouse name: Not on file   Number of children: Not on file   Years of education: Not on file   Highest education level: Not on file  Occupational History   Not on file  Tobacco Use   Smoking status: Former   Smokeless tobacco: Never  Vaping Use   Vaping status: Never Used  Substance and Sexual Activity   Alcohol use: Not Currently   Drug use: Never   Sexual activity: Not on file  Other Topics Concern   Not on file  Social History Narrative   Not on file   Social Drivers of Health   Financial Resource Strain: Not on file  Food  Insecurity: Not on file  Transportation Needs: Not on file  Physical Activity: Not on file  Stress: Not on file  Social Connections: Not on file  Intimate Partner Violence: Not on file    Review of Systems: See HPI, otherwise negative ROS  Physical Exam: LMP 10/20/2014 (Approximate)  General:   Alert,  pleasant and cooperative in NAD Head:  Normocephalic and atraumatic. Neck:  Supple; no masses or thyromegaly. Lungs:  Clear throughout to auscultation.    Heart:  Regular rate and rhythm. Abdomen:  Soft, nontender and nondistended. Normal bowel sounds, without guarding, and without rebound.   Neurologic:  Alert and  oriented x4;  grossly normal  neurologically.  Impression/Plan: Jacqueline Chavez is here for an colonoscopy to be performed for a history of adenomatous polyps on 2020   Risks, benefits, limitations, and alternatives regarding  colonoscopy have been reviewed with the patient.  Questions have been answered.  All parties agreeable.   Jacqueline Copping, MD  03/30/2024, 7:56 AM

## 2024-03-30 NOTE — Transfer of Care (Signed)
 Immediate Anesthesia Transfer of Care Note  Patient: Jacqueline Chavez  Procedure(s) Performed: COLONOSCOPY  Patient Location: PACU  Anesthesia Type:General  Level of Consciousness: awake  Airway & Oxygen Therapy: Patient Spontanous Breathing  Post-op Assessment: Report given to RN  Post vital signs: Reviewed, stable, and unstable  Last Vitals:  Vitals Value Taken Time  BP 164/128 03/30/24 08:53  Temp    Pulse 73 03/30/24 08:53  Resp 16 03/30/24 08:53  SpO2 99 % 03/30/24 08:53  Vitals shown include unfiled device data.  Last Pain:  Vitals:   03/30/24 0800  TempSrc: Tympanic         Complications: No notable events documented.

## 2024-03-30 NOTE — Op Note (Signed)
 [facility] Patient Name: Jacqueline Chavez Procedure Date: 03/30/2024 8:34 AM MRN: [id number] Account #: [id number] Date of Birth: February 27, 1961 Admit Type: Outpatient Age: 63 Room: [facility] ENDO ROOM 4 Gender: Female Note Status: Finalized Instrument Name: Colon Scope [id number] Procedure:             Colonoscopy Indications:           High risk colon cancer surveillance: Personal history                         of colonic polyps Providers:             Rogelia Copping MD, MD Referring MD:          Morna PHEBE Qua (Referring MD) Medicines:             Propofol  per Anesthesia Complications:         No immediate complications. Procedure:             Pre-Anesthesia Assessment:                        - Prior to the procedure, a History and Physical was                         performed, and patient medications and allergies were                         reviewed. The patient's tolerance of previous                         anesthesia was also reviewed. The risks and benefits                         of the procedure and the sedation options and risks                         were discussed with the patient. All questions were                         answered, and informed consent was obtained. Prior                         Anticoagulants: The patient has taken no anticoagulant                         or antiplatelet agents. ASA Grade Assessment: II - A                         patient with mild systemic disease. After reviewing                         the risks and benefits, the patient was deemed in                         satisfactory condition to undergo the procedure.                        After obtaining informed consent, the colonoscope was  passed under direct vision. Throughout the procedure,                         the patient's blood pressure, pulse, and oxygen                         saturations were monitored continuously. The                          Colonoscope was introduced through the anus and                         advanced to the the transverse colon. The colonoscopy                         was performed without difficulty. The patient                         tolerated the procedure well. The quality of the bowel                         preparation was not adequate to identify polyps                         greater than 5 mm in size. Findings:      The perianal and digital rectal examinations were normal.      A large amount of stool was found in the entire colon, precluding       visualization. Impression:            - Preparation of the colon was inadequate.                        - Stool in the entire examined colon.                        - No specimens collected. Recommendation:        - Discharge patient to home.                        - Resume previous diet.                        - Repeat colonoscopy because the bowel preparation was                         poor. Procedure Code(s):     --- Professional ---                        [phone], 53, Colonoscopy, flexible; diagnostic,                         including collection of specimen(s) by brushing or                         washing, when performed (separate procedure) Diagnosis Code(s):     --- Professional ---                        Z86.010, Personal history of colonic polyps  CPT copyright 2022 American Medical Association. All rights reserved. The codes documented in this report are preliminary and upon coder review may  be revised to meet current compliance requirements. Rogelia Copping MD, MD 03/30/2024 8:51:08 AM This report has been signed electronically. Number of Addenda: 0 Note Initiated On: 03/30/2024 8:34 AM Total Procedure Duration: 0 hours 2 minutes 38 seconds  Estimated Blood Loss:  Estimated blood loss: none.      [facility]

## 2024-03-30 NOTE — Telephone Encounter (Signed)
 Per Dr Jinny, poor prep. Needs repeat.

## 2024-03-30 NOTE — Telephone Encounter (Signed)
 Pt was contacted to reschedule her colonoscopy due to poor prep.  Colonoscopy has been scheduled for 07/05/24 with Dr. Jinny.  Thanks,  [place], CMA

## 2024-03-30 NOTE — Anesthesia Preprocedure Evaluation (Signed)
 Anesthesia Evaluation  Patient identified by MRN, date of birth, ID band Patient awake    Reviewed: Allergy & Precautions, H&P , NPO status , Patient's Chart, lab work & pertinent test results, reviewed documented beta blocker date and time   Airway Mallampati: II   Neck ROM: full    Dental  (+) Poor Dentition   Pulmonary neg pulmonary ROS, former smoker   Pulmonary exam normal        Cardiovascular Exercise Tolerance: Poor negative cardio ROS Normal cardiovascular exam Rhythm:regular Rate:Normal     Neuro/Psych negative neurological ROS  negative psych ROS   GI/Hepatic Neg liver ROS,GERD  Medicated,,  Endo/Other  diabetes, Poorly Controlled, Oral Hypoglycemic Agents  Class 3 obesity  Renal/GU negative Renal ROS  negative genitourinary   Musculoskeletal   Abdominal   Peds  Hematology negative hematology ROS (+)   Anesthesia Other Findings Past Medical History: No date: Diabetes mellitus without complication (HCC) No date: GERD (gastroesophageal reflux disease) No date: Gout No date: Hyperlipidemia Past Surgical History: 02/12/2019: COLONOSCOPY WITH PROPOFOL ; N/A     Comment:  Procedure: COLONOSCOPY WITH PROPOFOL ;  Surgeon: Jinny Carmine, MD;  Location: [facility];  Service:               Endoscopy;  Laterality: N/A;  Diabetic No date: PARTIAL HYSTERECTOMY 02/12/2019: POLYPECTOMY     Comment:  Procedure: POLYPECTOMY;  Surgeon: Jinny Carmine, MD;                Location: [facility];  Service: Endoscopy;; BMI    Body Mass Index: 44.29 kg/m     Reproductive/Obstetrics negative OB ROS                              Anesthesia Physical Anesthesia Plan  ASA: 3  Anesthesia Plan: General   Post-op Pain Management:    Induction:   PONV Risk Score and Plan:   Airway Management Planned:   Additional Equipment:   Intra-op Plan:   Post-operative  Plan:   Informed Consent: I have reviewed the patients History and Physical, chart, labs and discussed the procedure including the risks, benefits and alternatives for the proposed anesthesia with the patient or authorized representative who has indicated his/her understanding and acceptance.     Dental Advisory Given  Plan Discussed with: CRNA  Anesthesia Plan Comments:         Anesthesia Quick Evaluation

## 2024-03-30 NOTE — Anesthesia Postprocedure Evaluation (Signed)
 Anesthesia Post Note  Patient: Jacqueline Chavez  Procedure(s) Performed: COLONOSCOPY  Patient location during evaluation: PACU Anesthesia Type: General Level of consciousness: awake and alert Pain management: pain level controlled Vital Signs Assessment: post-procedure vital signs reviewed and stable Respiratory status: spontaneous breathing, nonlabored ventilation, respiratory function stable and patient connected to nasal cannula oxygen Cardiovascular status: blood pressure returned to baseline and stable Postop Assessment: no apparent nausea or vomiting Anesthetic complications: no   No notable events documented.   Last Vitals:  Vitals:   03/30/24 0905 03/30/24 0915  BP: 113/88 (!) 134/113  Pulse: 72   Resp: 15 16  Temp:    SpO2: 98% 99%    Last Pain:  Vitals:   03/30/24 0915  TempSrc:   PainSc: 0-No pain                 [place] KANDICE Clause

## 2024-04-15 ENCOUNTER — Other Ambulatory Visit: Payer: Self-pay

## 2024-04-15 MED ORDER — PEG 3350-KCL-NA BICARB-NACL 420 G PO SOLR: 4000.0000 mL | Freq: Once | ORAL | 0 refills | Status: AC

## 2024-05-12 ENCOUNTER — Inpatient Hospital Stay
Admission: RE | Admit: 2024-05-12 | Discharge: 2024-05-12 | Attending: Nurse Practitioner | Admitting: Nurse Practitioner

## 2024-05-12 DIAGNOSIS — Z1231 Encounter for screening mammogram for malignant neoplasm of breast: Secondary | ICD-10-CM | POA: Diagnosis present

## 2024-06-29 ENCOUNTER — Telehealth: Payer: Self-pay

## 2024-06-29 NOTE — Telephone Encounter (Signed)
 The patient returned Michelles call and stated she was unsure of the reason for the call. She wanted to confirm whether she still needed to call on 07/02/24 to obtain her procedure time. She was advised that she does need to call to verify the procedure time. A message was sent to [facility] to return the patients call, and the patient was advised to allow 24-48 hours for follow-up.

## 2024-07-05 ENCOUNTER — Encounter: Admission: RE | Payer: Self-pay | Source: Home / Self Care

## 2024-07-05 ENCOUNTER — Ambulatory Visit: Admit: 2024-07-05 | Admitting: Gastroenterology
